# Patient Record
Sex: Female | Born: 1950 | Race: Black or African American | Hispanic: No | Marital: Single | Smoking: Never smoker
Health system: Southern US, Community
[De-identification: ages and names within clinical notes are randomized; demographics above are authoritative.]

## PROBLEM LIST (undated history)

## (undated) DIAGNOSIS — I10 Essential (primary) hypertension: Secondary | ICD-10-CM

---

## 2008-09-30 ENCOUNTER — Emergency Department (HOSPITAL_COMMUNITY): Admission: EM | Admit: 2008-09-30 | Discharge: 2008-09-30 | Payer: Self-pay | Admitting: Emergency Medicine

## 2010-09-14 LAB — URINE MICROSCOPIC-ADD ON

## 2010-09-14 LAB — POCT I-STAT, CHEM 8
BUN: 9 mg/dL (ref 6–23)
Calcium, Ion: 1.12 mmol/L (ref 1.12–1.32)
Chloride: 102 mEq/L (ref 96–112)
HCT: 43 % (ref 36.0–46.0)
Sodium: 140 mEq/L (ref 135–145)

## 2010-09-14 LAB — DIFFERENTIAL
Basophils Absolute: 0 10*3/uL (ref 0.0–0.1)
Basophils Relative: 0 % (ref 0–1)
Lymphocytes Relative: 6 % — ABNORMAL LOW (ref 12–46)
Neutro Abs: 4.4 10*3/uL (ref 1.7–7.7)
Neutrophils Relative %: 80 % — ABNORMAL HIGH (ref 43–77)

## 2010-09-14 LAB — URINALYSIS, ROUTINE W REFLEX MICROSCOPIC
Glucose, UA: NEGATIVE mg/dL
Ketones, ur: NEGATIVE mg/dL
Nitrite: NEGATIVE
Protein, ur: NEGATIVE mg/dL
pH: 7 (ref 5.0–8.0)

## 2010-09-14 LAB — CBC
MCHC: 34.6 g/dL (ref 30.0–36.0)
Platelets: 292 10*3/uL (ref 150–400)
RDW: 14.9 % (ref 11.5–15.5)

## 2020-06-28 ENCOUNTER — Other Ambulatory Visit: Payer: Self-pay

## 2020-06-28 ENCOUNTER — Encounter (HOSPITAL_COMMUNITY): Payer: Self-pay | Admitting: Emergency Medicine

## 2020-06-28 ENCOUNTER — Emergency Department (HOSPITAL_COMMUNITY): Payer: Medicaid - Out of State

## 2020-06-28 ENCOUNTER — Emergency Department (HOSPITAL_COMMUNITY)
Admission: EM | Admit: 2020-06-28 | Discharge: 2020-06-29 | Disposition: A | Discharge status: Home / Self Care | Payer: Medicaid - Out of State | Attending: Emergency Medicine | Admitting: Emergency Medicine

## 2020-06-28 DIAGNOSIS — Z5321 Procedure and treatment not carried out due to patient leaving prior to being seen by health care provider: Secondary | ICD-10-CM | POA: Insufficient documentation

## 2020-06-28 DIAGNOSIS — R079 Chest pain, unspecified: Secondary | ICD-10-CM | POA: Diagnosis present

## 2020-06-28 DIAGNOSIS — R0602 Shortness of breath: Secondary | ICD-10-CM | POA: Diagnosis not present

## 2020-06-28 HISTORY — DX: Essential (primary) hypertension: I10

## 2020-06-28 LAB — BASIC METABOLIC PANEL
Anion gap: 10 (ref 5–15)
BUN: 21 mg/dL (ref 8–23)
CO2: 21 mmol/L — ABNORMAL LOW (ref 22–32)
Calcium: 9 mg/dL (ref 8.9–10.3)
Chloride: 102 mmol/L (ref 98–111)
Creatinine, Ser: 1.19 mg/dL — ABNORMAL HIGH (ref 0.44–1.00)
GFR, Estimated: 50 mL/min — ABNORMAL LOW (ref >60)
Glucose, Bld: 120 mg/dL — ABNORMAL HIGH (ref 70–99)
Potassium: 2.7 mmol/L — CL (ref 3.5–5.1)
Sodium: 133 mmol/L — ABNORMAL LOW (ref 135–145)

## 2020-06-28 LAB — CBC
HCT: 34.7 % — ABNORMAL LOW (ref 36.0–46.0)
Hemoglobin: 11.9 g/dL — ABNORMAL LOW (ref 12.0–15.0)
MCH: 31.5 pg (ref 26.0–34.0)
MCHC: 34.3 g/dL (ref 30.0–36.0)
MCV: 91.8 fL (ref 80.0–100.0)
Platelets: 264 10*3/uL (ref 150–400)
RBC: 3.78 MIL/uL — ABNORMAL LOW (ref 3.87–5.11)
RDW: 13.4 % (ref 11.5–15.5)
WBC: 12.6 10*3/uL — ABNORMAL HIGH (ref 4.0–10.5)
nRBC: 0 % (ref 0.0–0.2)

## 2020-06-28 LAB — TROPONIN I (HIGH SENSITIVITY): Troponin I (High Sensitivity): 24 ng/L — ABNORMAL HIGH (ref <18)

## 2020-06-28 NOTE — ED Notes (Signed)
Pt called 3x no answer  

## 2020-06-28 NOTE — ED Triage Notes (Signed)
Patient with right and central chest pain that started yesterday.  She states that she does have some shortness of breath with the pain.  No nausea or vomiting at this time.

## 2020-06-30 ENCOUNTER — Emergency Department (HOSPITAL_COMMUNITY): Payer: Medicaid - Out of State

## 2020-06-30 ENCOUNTER — Encounter (HOSPITAL_COMMUNITY): Payer: Self-pay | Admitting: Emergency Medicine

## 2020-06-30 ENCOUNTER — Other Ambulatory Visit: Payer: Self-pay

## 2020-06-30 ENCOUNTER — Emergency Department (HOSPITAL_COMMUNITY)
Admission: EM | Admit: 2020-06-30 | Discharge: 2020-07-01 | Disposition: A | Discharge status: Home / Self Care | Payer: Medicaid - Out of State | Attending: Emergency Medicine | Admitting: Emergency Medicine

## 2020-06-30 DIAGNOSIS — J189 Pneumonia, unspecified organism: Secondary | ICD-10-CM | POA: Insufficient documentation

## 2020-06-30 DIAGNOSIS — I10 Essential (primary) hypertension: Secondary | ICD-10-CM | POA: Insufficient documentation

## 2020-06-30 DIAGNOSIS — R079 Chest pain, unspecified: Secondary | ICD-10-CM | POA: Diagnosis present

## 2020-06-30 DIAGNOSIS — Z20822 Contact with and (suspected) exposure to covid-19: Secondary | ICD-10-CM | POA: Insufficient documentation

## 2020-06-30 LAB — CBC
HCT: 35.2 % — ABNORMAL LOW (ref 36.0–46.0)
Hemoglobin: 11.3 g/dL — ABNORMAL LOW (ref 12.0–15.0)
MCH: 30.4 pg (ref 26.0–34.0)
MCHC: 32.1 g/dL (ref 30.0–36.0)
MCV: 94.6 fL (ref 80.0–100.0)
Platelets: 307 10*3/uL (ref 150–400)
RBC: 3.72 MIL/uL — ABNORMAL LOW (ref 3.87–5.11)
RDW: 13.4 % (ref 11.5–15.5)
WBC: 15 10*3/uL — ABNORMAL HIGH (ref 4.0–10.5)
nRBC: 0 % (ref 0.0–0.2)

## 2020-06-30 LAB — BASIC METABOLIC PANEL
Anion gap: 10 (ref 5–15)
BUN: 6 mg/dL — ABNORMAL LOW (ref 8–23)
CO2: 25 mmol/L (ref 22–32)
Calcium: 8.6 mg/dL — ABNORMAL LOW (ref 8.9–10.3)
Chloride: 98 mmol/L (ref 98–111)
Creatinine, Ser: 0.77 mg/dL (ref 0.44–1.00)
GFR, Estimated: >60 mL/min (ref >60)
Glucose, Bld: 99 mg/dL (ref 70–99)
Potassium: 2.6 mmol/L — CL (ref 3.5–5.1)
Sodium: 133 mmol/L — ABNORMAL LOW (ref 135–145)

## 2020-06-30 LAB — POC SARS CORONAVIRUS 2 AG -  ED: SARS Coronavirus 2 Ag: NEGATIVE

## 2020-06-30 LAB — TROPONIN I (HIGH SENSITIVITY)
Troponin I (High Sensitivity): 34 ng/L — ABNORMAL HIGH (ref <18)
Troponin I (High Sensitivity): 31 ng/L — ABNORMAL HIGH (ref <18)

## 2020-06-30 MED ORDER — POTASSIUM CHLORIDE 10 MEQ/100ML IV SOLN
10.0000 meq | INTRAVENOUS | Status: AC
  Administered 2020-06-30 – 2020-07-01 (×2): 10 meq via INTRAVENOUS
  Filled 2020-06-30 (×2): qty 100

## 2020-06-30 MED ORDER — ALBUTEROL SULFATE HFA 108 (90 BASE) MCG/ACT IN AERS
2.0000 | INHALATION_SPRAY | Freq: Four times a day (QID) | RESPIRATORY_TRACT | Status: DC
  Administered 2020-07-01: 2 via RESPIRATORY_TRACT
  Filled 2020-06-30: qty 6.7

## 2020-06-30 MED ORDER — OXYCODONE-ACETAMINOPHEN 5-325 MG PO TABS
1.0000 | ORAL_TABLET | Freq: Once | ORAL | Status: AC
  Administered 2020-07-01: 1 via ORAL
  Filled 2020-06-30: qty 1

## 2020-06-30 MED ORDER — POTASSIUM CHLORIDE CRYS ER 20 MEQ PO TBCR
40.0000 meq | EXTENDED_RELEASE_TABLET | Freq: Once | ORAL | Status: AC
  Administered 2020-06-30: 40 meq via ORAL
  Filled 2020-06-30: qty 2

## 2020-06-30 MED ORDER — DOXYCYCLINE HYCLATE 100 MG PO CAPS: 100.0000 mg | ORAL_CAPSULE | Freq: Two times a day (BID) | ORAL | 0 refills | Status: DC

## 2020-06-30 MED ORDER — DOXYCYCLINE HYCLATE 100 MG PO TABS
100.0000 mg | ORAL_TABLET | Freq: Once | ORAL | Status: AC
  Administered 2020-07-01: 100 mg via ORAL
  Filled 2020-06-30: qty 1

## 2020-06-30 NOTE — ED Triage Notes (Signed)
Pt c/o right sided chest and breast pain x 3 days. Pt appears short of breath as well. No other symptoms at this time.

## 2020-06-30 NOTE — Discharge Instructions (Addendum)
You were seen in the emergency department for right-sided breast/chest pain, cough, phlegm  Chest x-ray showed pneumonia in your right upper lung  Your quick Covid test is negative but your second PCR Covid test is pending.  This second COVID test is more accurate and will confirm or rule out COVID infection.  The PCR COVID test results will be back in the next 6 to 24 hours.  I recommend you isolate and wear a mask at all times until your results are back.  You were also tested for tuberculosis. Check your MyChart account for these results.  Instructions to make a MyChart account are below.   Take doxycycline which is an antibiotic morning and night for the next 7 days.  Take 2 puffs of albuterol inhaler every 6 hours to help open up your airways and help with chest tightness, shortness of breath, cough  For pain you can take 650 mg to 1000 mg of acetaminophen (Tylenol) every 6-8 hours  Return to the ED for worsening pain, fever, shortness of breath  Make an appointment with your primary care doctor in the next 3-4 weeks, you need a repeat chest x-ray to make sure this pneumonia has cleared after antibiotics

## 2020-06-30 NOTE — ED Provider Notes (Cosign Needed Addendum)
[facility] EMERGENCY DEPARTMENT Provider Note   CSN: [id number] Arrival date & time: 06/30/20  1814     History Chief Complaint  Patient presents with  . Chest Pain    Antrice Pal is a 70 y.o. female with history of hypertension presents to the ED for evaluation of "breast pain" that began Sunday night. Sharp, non radiating, non exertional.  This is constant, mild but much worse with any movement, twisting, coughing. Has been taking BC powders and Alka-Seltzer plus without improvement. Has associated productive cough with clear and green phlegm, chills and shortness of breath with exertion. Unknown fevers. Some nausea but no vomiting. Unvaccinated for COVID-19. Reports her friend had similar symptoms but doesn't know if he had COVID. Patient lives in [place] and is here visiting her daughter. Reports smoking crack cocaine at least 1-2 times a month, last use was 10 days ago. No tobacco use. Denies any other illicit drug use including IV drugs. Denies congestion, sore throat, headaches, vomiting, abdominal pain, diarrhea, changes in taste or smell. No hemoptysis. No history of DVT or PE, leg swelling, calf pain, recent surgery, prolonged immobilization, hormone therapy. No history or exposure to TB, recent incarceration, homelessness, travel.   HPI     Past Medical History:  Diagnosis Date  . Hypertension     There are no problems to display for this patient.   History reviewed. No pertinent surgical history.   OB History   No obstetric history on file.     No family history on file.  Social History   Tobacco Use  . Smoking status: Never Smoker  . Smokeless tobacco: Never Used    Home Medications Prior to Admission medications   Medication Sig Start Date End Date Taking? Authorizing Provider  doxycycline (VIBRAMYCIN) 100 MG capsule Take 1 capsule (100 mg total) by mouth 2 (two) times daily. 06/30/20  Yes Liberty Handy, PA-C     Allergies    Sulfa antibiotics  Review of Systems   Review of Systems  Constitutional: Positive for chills.  Respiratory: Positive for cough and shortness of breath.   Cardiovascular: Positive for chest pain.  All other systems reviewed and are negative.   Physical Exam Updated Vital Signs BP (!) 148/70   Pulse 75   Temp 99.8 F (37.7 C) (Oral)   Resp (!) 27   SpO2 97%   Physical Exam Vitals and nursing note reviewed.  Constitutional:      General: She is not in acute distress.    Appearance: She is well-developed and well-nourished.     Comments: NAD.  HENT:     Head: Normocephalic and atraumatic.     Right Ear: External ear normal.     Left Ear: External ear normal.     Nose: Nose normal.  Eyes:     General: No scleral icterus.    Extraocular Movements: EOM normal.     Conjunctiva/sclera: Conjunctivae normal.  Cardiovascular:     Rate and Rhythm: Normal rate and regular rhythm.     Heart sounds: Normal heart sounds. No murmur heard.     Comments: No lower extremity edema. No calf tenderness. Pulmonary:     Effort: Pulmonary effort is normal. Tachypnea present.     Breath sounds: Examination of the left-upper field reveals rhonchi. Examination of the left-middle field reveals rhonchi. Rhonchi present. No wheezing.     Comments: Wet sounding cough during exam. Tachypnea, RR in the mid 30s. Subtle rhonchi/rales in the  right upper/middle lobes. No wheezing. Musculoskeletal:        General: No deformity. Normal range of motion.     Cervical back: Normal range of motion and neck supple.  Skin:    General: Skin is warm and dry.     Capillary Refill: Capillary refill takes less than 2 seconds.  Neurological:     Mental Status: She is alert and oriented to person, place, and time.  Psychiatric:        Mood and Affect: Mood and affect normal.        Behavior: Behavior normal.        Thought Content: Thought content normal.        Judgment: Judgment normal.      ED Results / Procedures / Treatments   Labs (all labs ordered are listed, but only abnormal results are displayed) Labs Reviewed  BASIC METABOLIC PANEL - Abnormal; Notable for the following components:      Result Value   Sodium 133 (*)    Potassium 2.6 (*)    BUN 6 (*)    Calcium 8.6 (*)    All other components within normal limits  CBC - Abnormal; Notable for the following components:   WBC 15.0 (*)    RBC 3.72 (*)    Hemoglobin 11.3 (*)    HCT 35.2 (*)    All other components within normal limits  TROPONIN I (HIGH SENSITIVITY) - Abnormal; Notable for the following components:   Troponin I (High Sensitivity) 34 (*)    All other components within normal limits  TROPONIN I (HIGH SENSITIVITY) - Abnormal; Notable for the following components:   Troponin I (High Sensitivity) 31 (*)    All other components within normal limits  SARS CORONAVIRUS 2 (TAT 6-24 HRS)  QUANTIFERON-TB GOLD PLUS  POC SARS CORONAVIRUS 2 AG -  ED    EKG None  Radiology DG Chest 2 View  Result Date: 06/30/2020 CLINICAL DATA:  70 year old female with chest pain. EXAM: CHEST - 2 VIEW COMPARISON:  Chest radiograph dated 06/28/2020. FINDINGS: Slight interval progression of patchy right upper lobe opacity most consistent with pneumonia. Clinical correlation and follow-up to resolution recommended. No pleural effusion pneumothorax. The cardiac silhouette is within limits. Atherosclerotic calcification of the aortic arch. No acute osseous pathology. IMPRESSION: Persistent right upper lobe pneumonia. Clinical correlation and follow-up to resolution recommended. Electronically Signed   By: Elgie Collard M.D.   On: 06/30/2020 18:36    Procedures Procedures   Medications Ordered in ED Medications  potassium chloride 10 mEq in 100 mL IVPB (10 mEq Intravenous New Bag/Given 06/30/20 2136)  doxycycline (VIBRA-TABS) tablet 100 mg (has no administration in time range)  oxyCODONE-acetaminophen (PERCOCET/ROXICET)  5-325 MG per tablet 1 tablet (has no administration in time range)  albuterol (VENTOLIN HFA) 108 (90 Base) MCG/ACT inhaler 2 puff (has no administration in time range)  potassium chloride SA (KLOR-CON) CR tablet 40 mEq (40 mEq Oral Given 06/30/20 2138)    ED Course  I have reviewed the triage vital signs and the nursing notes.  Pertinent labs & imaging results that were available during my care of the patient were reviewed by me and considered in my medical decision making (see chart for details).  Clinical Course as of 06/30/20 2327  Wed Jun 30, 2020  2056 DG Chest 2 View FINDINGS: Slight interval progression of patchy right upper lobe opacity most consistent with pneumonia. Clinical correlation and follow-up to resolution recommended. No pleural effusion pneumothorax. The  cardiac silhouette is within limits. Atherosclerotic calcification of the aortic arch. No acute osseous pathology. [CG]  2056 Temp: 99.8 F (37.7 C) [CG]  2056 WBC(!): 15.0 [CG]  2056 Hemoglobin(!): 11.3 [CG]  2056 Potassium(!!): 2.6 [CG]  2056 Troponin I (High Sensitivity)(!): 34 [CG]  2201 ED EKG Sinus rhythm with occasional Premature ventricular complexes Nonspecific ST and T wave abnormality Abnormal ECG No STEMI Confirmed by Alona Bene [id number]) on 06/30/2020 8:59:21 PM [CG]  2239 Re-evaluated patient. Ambulated to bathroom - denies SOB. Will ask RN to formally ambulate.  Denies exposure to TB, recent incarceration, homeless shelters. Had friend who was sick but doesn't know if he had COVID [CG]    Clinical Course User Index [CG] Jerrell Mylar   MDM Rules/Calculators/A&P                           70 y.o. yo with chief complaint of right sided "breast" pain for 4 days with productive cough, chills, exertional SOB.  Recent sick contacts, unvaccinated for COVID. Smokes crack cocaine, last 10 days ago. No IVDU. Low risk for TB.   Chief complain involves an extensive number of treatment options and  is a complaint that carries with it a high risk of complications and morbidity and mortality.    Differential diagnosis: COVID, community acquired PNA.  Considered PE however she has no hypoxia, tachycardia and has productive cough with phlegm.  ACS considered less likely as well given respiratory symptoms, right sided well localized sharp pain.   ER lab work and imaging ordered by triage RN and me, as above  I have personally visualized and interpreted ER diagnostic work up including labs and imaging.    Labs reveal - WBC 15.0, hemoglobin 11.3, K 2.6, Na 133. Trop 34 > 31. EKG with non specific TW abnormalities, no others to compare.  POC COVID negative, suspicion is high given sick contacts and symptoms. PCR pending at discharge. Quantiferon gold plus pending at discharge.  HEART score is technically 4 however CP is very atypical as well as URI symptoms, well localized, positional and posttussive CP, ACS considered but unlikely.    Imaging reveals - interval progression of right upper lobe opacity consistent with pneumonia, no effusion.    Medications ordered - K IV and PO, percocet for pain   Ordered continuous cardiac and pulse ox monitoring.  Will plan for serial re-examinations. Close monitoring. Will ambulate with pulse ox.   2324: Re-evaluated the patient.   Ambulated without hypoxia. Eating [employer]. Appears well.  Afebrile and with normal HR, SpO2 thus far.    Overall, clinical presentation and ER work up most suggestive of likely community acquired pneumonia.  PCR and quantiferon gold pending at discharge. Will discharge with doxycycline, albuterol inhaler, tylenol for pain. Return precautions discussed. Patient aware of symptoms that would warrant return to ER. She is aware she needs repeat CXR in 3-4 weeks to check for resolution. Discussed with EDP Long.   Final Clinical Impression(s) / ED Diagnoses Final diagnoses:  Pneumonia of right upper lobe due to infectious organism     Rx / DC Orders ED Discharge Orders         Ordered    doxycycline (VIBRAMYCIN) 100 MG capsule  2 times daily        06/30/20 2327           Liberty Handy, PA-C 06/30/20 2327    Liberty Handy, PA-C 06/30/20  7253    Maia Plan, MD 07/02/20 1151

## 2020-06-30 NOTE — ED Notes (Signed)
Ambulated pt on pulse ox, spo2 dropped to 93 

## 2020-07-03 LAB — QUANTIFERON-TB GOLD PLUS: QuantiFERON-TB Gold Plus: UNDETERMINED — AB

## 2020-07-03 LAB — QUANTIFERON-TB GOLD PLUS (RQFGPL)
QuantiFERON Mitogen Value: 0.3 IU/mL
QuantiFERON Nil Value: 0 IU/mL
QuantiFERON TB1 Ag Value: 0 IU/mL
QuantiFERON TB2 Ag Value: 0 IU/mL

## 2020-11-29 ENCOUNTER — Other Ambulatory Visit: Payer: Self-pay | Admitting: Family Medicine

## 2020-11-29 DIAGNOSIS — I10 Essential (primary) hypertension: Secondary | ICD-10-CM | POA: Diagnosis not present

## 2020-11-29 DIAGNOSIS — Z1231 Encounter for screening mammogram for malignant neoplasm of breast: Secondary | ICD-10-CM

## 2020-12-15 ENCOUNTER — Telehealth: Payer: Self-pay | Admitting: Hematology and Oncology

## 2020-12-15 NOTE — Telephone Encounter (Signed)
Received a new hem referral from [facility] for leukopenia. [facility] has been cld and scheduled to see Dr. Leonides Schanz on 7/20 at 1pm. Pt aware to arrive 20 minutes early

## 2020-12-22 ENCOUNTER — Other Ambulatory Visit: Payer: Self-pay

## 2020-12-22 ENCOUNTER — Inpatient Hospital Stay: Payer: Medicaid - Out of State | Attending: Hematology and Oncology | Admitting: Hematology and Oncology

## 2020-12-22 ENCOUNTER — Inpatient Hospital Stay: Payer: Medicaid - Out of State

## 2020-12-22 VITALS — BP 167/88 | HR 71 | Temp 97.9°F | Resp 18 | Ht 63.0 in | Wt 161.5 lb

## 2020-12-22 DIAGNOSIS — D709 Neutropenia, unspecified: Secondary | ICD-10-CM | POA: Diagnosis not present

## 2020-12-22 DIAGNOSIS — D708 Other neutropenia: Secondary | ICD-10-CM

## 2020-12-22 LAB — VITAMIN B12: Vitamin B-12: 79 pg/mL — ABNORMAL LOW (ref 180–914)

## 2020-12-22 LAB — CBC WITH DIFFERENTIAL (CANCER CENTER ONLY)
Abs Immature Granulocytes: 0 10*3/uL (ref 0.00–0.07)
Basophils Absolute: 0 10*3/uL (ref 0.0–0.1)
Basophils Relative: 1 %
Eosinophils Absolute: 0.1 10*3/uL (ref 0.0–0.5)
Eosinophils Relative: 3 %
HCT: 37.4 % (ref 36.0–46.0)
Hemoglobin: 12.7 g/dL (ref 12.0–15.0)
Immature Granulocytes: 0 %
Lymphocytes Relative: 19 %
Lymphs Abs: 0.6 10*3/uL — ABNORMAL LOW (ref 0.7–4.0)
MCH: 30 pg (ref 26.0–34.0)
MCHC: 34 g/dL (ref 30.0–36.0)
MCV: 88.2 fL (ref 80.0–100.0)
Monocytes Absolute: 0.8 10*3/uL (ref 0.1–1.0)
Monocytes Relative: 27 %
Neutro Abs: 1.5 10*3/uL — ABNORMAL LOW (ref 1.7–7.7)
Neutrophils Relative %: 50 %
Platelet Count: 325 10*3/uL (ref 150–400)
RBC: 4.24 MIL/uL (ref 3.87–5.11)
RDW: 14.5 % (ref 11.5–15.5)
WBC Count: 3 10*3/uL — ABNORMAL LOW (ref 4.0–10.5)
nRBC: 0 % (ref 0.0–0.2)

## 2020-12-22 LAB — CMP (CANCER CENTER ONLY)
ALT: 14 U/L (ref 0–44)
AST: 22 U/L (ref 15–41)
Albumin: 4.2 g/dL (ref 3.5–5.0)
Alkaline Phosphatase: 109 U/L (ref 38–126)
Anion gap: 7 (ref 5–15)
BUN: 10 mg/dL (ref 8–23)
CO2: 28 mmol/L (ref 22–32)
Calcium: 9.7 mg/dL (ref 8.9–10.3)
Chloride: 106 mmol/L (ref 98–111)
Creatinine: 0.56 mg/dL (ref 0.44–1.00)
GFR, Estimated: >60 mL/min (ref >60)
Glucose, Bld: 96 mg/dL (ref 70–99)
Potassium: 3.5 mmol/L (ref 3.5–5.1)
Sodium: 141 mmol/L (ref 135–145)
Total Bilirubin: 0.8 mg/dL (ref 0.3–1.2)
Total Protein: 8.8 g/dL — ABNORMAL HIGH (ref 6.5–8.1)

## 2020-12-22 LAB — HEPATITIS B SURFACE ANTIGEN: Hepatitis B Surface Ag: NONREACTIVE

## 2020-12-22 LAB — FOLATE: Folate: 19.4 ng/mL (ref >5.9)

## 2020-12-22 LAB — LACTATE DEHYDROGENASE: LDH: 220 U/L — ABNORMAL HIGH (ref 98–192)

## 2020-12-22 LAB — SAVE SMEAR(SSMR), FOR PROVIDER SLIDE REVIEW

## 2020-12-22 LAB — HIV ANTIBODY (ROUTINE TESTING W REFLEX): HIV Screen 4th Generation wRfx: NONREACTIVE

## 2020-12-22 LAB — HEPATITIS C ANTIBODY: HCV Ab: NONREACTIVE

## 2020-12-22 LAB — HEPATITIS B CORE ANTIBODY, TOTAL: Hep B Core Total Ab: REACTIVE — AB

## 2020-12-22 LAB — SEDIMENTATION RATE: Sed Rate: 26 mm/hr — ABNORMAL HIGH (ref 0–22)
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
## 2020-12-23 ENCOUNTER — Telehealth: Payer: Self-pay | Admitting: *Deleted

## 2020-12-23 MED ORDER — VITAMIN B-12 1000 MCG PO TABS: 1000.0000 ug | ORAL_TABLET | Freq: Every day | ORAL | 3 refills | Status: DC

## 2020-12-23 NOTE — Telephone Encounter (Signed)
TCT patient regarding recent lab results. Spoke with pt and advised that the only abnormality was a low B12 level. Advised that Dr. Leonides Schanz recommends a B12 supplement daily. Pt is agreeable to this. Pt's pharmacy is [facility] on [address].

## 2020-12-23 NOTE — Telephone Encounter (Signed)
-----   Message from Jaci Standard, MD sent at 12/23/2020  8:17 AM EDT ----- Please let Mrs. Bresnan know that her labs were consistent with Vitamin B12 deficiency. Please call in PO daily of vit B12 to a pharmacy of her choosing. She will need an appointment in 3 months time to reassess.  ----- Message ----- From: Leory Plowman, [facility] Sent: 12/22/2020   2:40 PM EDT To: Jaci Standard, MD

## 2021-01-03 ENCOUNTER — Telehealth: Payer: Self-pay | Admitting: Hematology and Oncology

## 2021-01-03 NOTE — Telephone Encounter (Signed)
Scheduled appts per 8/1 sch msg. Pt aware.  

## 2021-01-05 ENCOUNTER — Telehealth: Payer: Self-pay | Admitting: *Deleted

## 2021-01-05 NOTE — Telephone Encounter (Signed)
-----   Message from Jaci Standard, MD sent at 01/03/2021  2:10 PM EDT ----- Please let [facility] know that her labs are most consistent with Vitamin b12 deficiency. This is the likely cause of her low WBC. Please call in Vitamin b12 PO daily to a pharmacy of her choosing. We will plan to see her back in 3 months to re-evaluate

## 2021-01-05 NOTE — Telephone Encounter (Signed)
Pt has been made aware of low B12 on 12/23/20

## 2021-01-21 DIAGNOSIS — M25561 Pain in right knee: Secondary | ICD-10-CM | POA: Diagnosis not present

## 2021-01-21 DIAGNOSIS — M25562 Pain in left knee: Secondary | ICD-10-CM | POA: Diagnosis not present

## 2021-01-21 DIAGNOSIS — M1711 Unilateral primary osteoarthritis, right knee: Secondary | ICD-10-CM | POA: Diagnosis not present

## 2021-01-21 DIAGNOSIS — M1712 Unilateral primary osteoarthritis, left knee: Secondary | ICD-10-CM | POA: Diagnosis not present

## 2021-01-31 ENCOUNTER — Inpatient Hospital Stay: Admission: RE | Admit: 2021-01-31 | Payer: Medicaid - Out of State | Source: Ambulatory Visit

## 2021-03-01 DIAGNOSIS — M1711 Unilateral primary osteoarthritis, right knee: Secondary | ICD-10-CM | POA: Diagnosis not present

## 2021-05-03 DIAGNOSIS — M1711 Unilateral primary osteoarthritis, right knee: Secondary | ICD-10-CM | POA: Diagnosis not present

## 2021-06-27 ENCOUNTER — Ambulatory Visit (HOSPITAL_COMMUNITY)
Admission: EM | Admit: 2021-06-27 | Discharge: 2021-06-27 | Disposition: A | Discharge status: Home / Self Care | Payer: Medicaid Other

## 2021-06-27 ENCOUNTER — Other Ambulatory Visit: Payer: Self-pay

## 2021-06-27 ENCOUNTER — Emergency Department (HOSPITAL_COMMUNITY): Payer: Medicaid Other

## 2021-06-27 ENCOUNTER — Encounter (HOSPITAL_COMMUNITY): Payer: Self-pay

## 2021-06-27 ENCOUNTER — Emergency Department (HOSPITAL_COMMUNITY)
Admission: EM | Admit: 2021-06-27 | Discharge: 2021-06-28 | Disposition: A | Discharge status: Home / Self Care | Payer: Medicaid Other | Attending: Emergency Medicine | Admitting: Emergency Medicine

## 2021-06-27 DIAGNOSIS — K5901 Slow transit constipation: Secondary | ICD-10-CM

## 2021-06-27 DIAGNOSIS — Z79899 Other long term (current) drug therapy: Secondary | ICD-10-CM | POA: Insufficient documentation

## 2021-06-27 DIAGNOSIS — I161 Hypertensive emergency: Secondary | ICD-10-CM

## 2021-06-27 DIAGNOSIS — R519 Headache, unspecified: Secondary | ICD-10-CM | POA: Diagnosis not present

## 2021-06-27 DIAGNOSIS — I10 Essential (primary) hypertension: Secondary | ICD-10-CM | POA: Diagnosis not present

## 2021-06-27 DIAGNOSIS — R110 Nausea: Secondary | ICD-10-CM | POA: Diagnosis not present

## 2021-06-27 DIAGNOSIS — K5900 Constipation, unspecified: Secondary | ICD-10-CM | POA: Insufficient documentation

## 2021-06-27 DIAGNOSIS — R079 Chest pain, unspecified: Secondary | ICD-10-CM | POA: Diagnosis not present

## 2021-06-27 DIAGNOSIS — R109 Unspecified abdominal pain: Secondary | ICD-10-CM | POA: Diagnosis not present

## 2021-06-27 LAB — COMPREHENSIVE METABOLIC PANEL
ALT: 18 U/L (ref 0–44)
AST: 26 U/L (ref 15–41)
Albumin: 4.1 g/dL (ref 3.5–5.0)
Alkaline Phosphatase: 104 U/L (ref 38–126)
Anion gap: 9 (ref 5–15)
BUN: <5 mg/dL — ABNORMAL LOW (ref 8–23)
CO2: 26 mmol/L (ref 22–32)
Calcium: 9.6 mg/dL (ref 8.9–10.3)
Chloride: 103 mmol/L (ref 98–111)
Creatinine, Ser: 0.69 mg/dL (ref 0.44–1.00)
GFR, Estimated: >60 mL/min (ref >60)
Glucose, Bld: 102 mg/dL — ABNORMAL HIGH (ref 70–99)
Potassium: 3 mmol/L — ABNORMAL LOW (ref 3.5–5.1)
Sodium: 138 mmol/L (ref 135–145)
Total Bilirubin: 0.9 mg/dL (ref 0.3–1.2)
Total Protein: 9.1 g/dL — ABNORMAL HIGH (ref 6.5–8.1)

## 2021-06-27 LAB — CBC
HCT: 43.3 % (ref 36.0–46.0)
Hemoglobin: 14 g/dL (ref 12.0–15.0)
MCH: 27.8 pg (ref 26.0–34.0)
MCHC: 32.3 g/dL (ref 30.0–36.0)
MCV: 86.1 fL (ref 80.0–100.0)
Platelets: 298 10*3/uL (ref 150–400)
RBC: 5.03 MIL/uL (ref 3.87–5.11)
RDW: 14.5 % (ref 11.5–15.5)
WBC: 2.9 10*3/uL — ABNORMAL LOW (ref 4.0–10.5)
nRBC: 0 % (ref 0.0–0.2)

## 2021-06-27 LAB — URINALYSIS, ROUTINE W REFLEX MICROSCOPIC
Bacteria, UA: NONE SEEN
Bilirubin Urine: NEGATIVE
Glucose, UA: NEGATIVE mg/dL
Ketones, ur: NEGATIVE mg/dL
Leukocytes,Ua: NEGATIVE
Nitrite: NEGATIVE
Protein, ur: NEGATIVE mg/dL
Specific Gravity, Urine: 1.011 (ref 1.005–1.030)
pH: 6 (ref 5.0–8.0)

## 2021-06-27 LAB — LIPASE, BLOOD: Lipase: 35 U/L (ref 11–51)

## 2021-06-27 NOTE — ED Triage Notes (Signed)
Pt c/o constipation x5-6days, pain to lower back, and decrease appetite.   Pt requesting refill on her b/p meds., states has been out for 3wks.

## 2021-06-27 NOTE — ED Provider Triage Note (Signed)
Emergency Medicine Provider Triage Evaluation Note  Valerie Dunn , a 71 y.o. female  was evaluated in triage.  Pt was sent from urgent care due to hypertension.  Has been out of medication for a few weeks and they told her that she was potentially having a stroke.  She presented to urgent care for concern of no bowel movement in 4 to 5 days.  Still passing gas.  Took 1 laxative on 1 occasion and says it did not work.  Review of Systems  Positive:  Negative:   Physical Exam  BP (!) 171/109    Pulse 92    Temp 98.2 F (36.8 C) (Oral)    Resp 18    SpO2 96%  Gen:   Awake, no distress   Resp:  Normal effort  MSK:   Moves extremities without difficulty  Other:  Patient with no focal deficits, 5 out of 5 strength in all extremities, no evidence of stroke.  Elevated blood pressure however does not need to be further evaluated for stroke at this time  Abdomen somewhat distended, nontender  Medical Decision Making  Medically screening exam initiated at 6:53 PM.  Appropriate orders placed.  Valerie Dunn was informed that the remainder of the evaluation will be completed by another provider, this initial triage assessment does not replace that evaluation, and the importance of remaining in the ED until their evaluation is complete.    Valerie Dunn, [place] 06/27/21 1855

## 2021-06-27 NOTE — ED Provider Notes (Signed)
[place]    CSN: [id number] Arrival date & time: 06/27/21  1521      History   Chief Complaint Chief Complaint  Patient presents with   Constipation    HPI Valerie Dunn is a 71 y.o. female presenting with constipation for about 6 days, back pain, nausea, dizziness, headaches.  She has been out of her blood pressure medications for about 3 weeks as she is out of town.  States that she has not had a bowel movement in about 6 days and she is still passing gas.  Now she is having some nausea without vomiting.  Endorses some left-sided abdominal pain, as well as back pain.  Has tried an over-the-counter laxative, this did not provide any relief.  Also states that she has been out of her blood pressure medications for about 3 weeks, she is now having headaches and dizziness, but denies vision changes, weakness, chest pain, shortness of breath. No prior history abd surgeries.  HPI  Past Medical History:  Diagnosis Date   Hypertension     There are no problems to display for this patient.   History reviewed. No pertinent surgical history.  OB History   No obstetric history on file.      Home Medications    Prior to Admission medications   Medication Sig Start Date End Date Taking? Authorizing Provider  amLODipine (NORVASC) 5 MG tablet Take 5 mg by mouth daily. 11/29/20   [provider]  traZODone (DESYREL) 50 MG tablet Take 50 mg by mouth at bedtime as needed. 11/29/20   [provider]  vitamin B-12 (CYANOCOBALAMIN) 1000 MCG tablet Take 1 tablet (1,000 mcg total) by mouth daily. 12/23/20   Orson Slick, MD    Family History History reviewed. No pertinent family history.  Social History Social History   Tobacco Use   Smoking status: Never   Smokeless tobacco: Never  Substance Use Topics   Alcohol use: Not Currently   Drug use: Not Currently     Allergies   Sulfa antibiotics   Review of Systems Review of Systems   Constitutional:  Negative for appetite change, chills, diaphoresis, fever and unexpected weight change.  HENT:  Negative for congestion, ear pain, sinus pressure, sinus pain, sneezing, sore throat and trouble swallowing.   Respiratory:  Negative for cough, chest tightness and shortness of breath.   Cardiovascular:  Negative for chest pain.  Gastrointestinal:  Positive for abdominal pain. Negative for abdominal distention, anal bleeding, blood in stool, constipation, diarrhea, nausea, rectal pain and vomiting.  Genitourinary:  Negative for dysuria, flank pain, frequency and urgency.  Musculoskeletal:  Negative for back pain and myalgias.  Neurological:  Positive for dizziness and headaches. Negative for light-headedness.  All other systems reviewed and are negative.   Physical Exam Triage Vital Signs ED Triage Vitals  Enc Vitals Group     BP 06/27/21 1615 (!) 199/118     Pulse Rate 06/27/21 1615 83     Resp 06/27/21 1615 18     Temp 06/27/21 1615 98 F (36.7 C)     Temp Source 06/27/21 1615 Oral     SpO2 06/27/21 1615 97 %     Weight --      Height --      Head Circumference --      Peak Flow --      Pain Score 06/27/21 1616 1     Pain Loc --      Pain Edu? --  Excl. in GC? --    No data found.  Updated Vital Signs BP (!) 199/118 (BP Location: Left Arm)    Pulse 83    Temp 98 F (36.7 C) (Oral)    Resp 18    SpO2 97%   Visual Acuity Right Eye Distance:   Left Eye Distance:   Bilateral Distance:    Right Eye Near:   Left Eye Near:    Bilateral Near:     Physical Exam Vitals reviewed.  Constitutional:      Appearance: Normal appearance. She is not diaphoretic.  HENT:     Head: Normocephalic and atraumatic.     Mouth/Throat:     Mouth: Mucous membranes are moist.  Eyes:     Extraocular Movements: Extraocular movements intact.     Pupils: Pupils are equal, round, and reactive to light.  Cardiovascular:     Rate and Rhythm: Normal rate and regular rhythm.      Pulses:          Radial pulses are 2+ on the right side and 2+ on the left side.     Heart sounds: Normal heart sounds.  Pulmonary:     Effort: Pulmonary effort is normal.     Breath sounds: Normal breath sounds.  Abdominal:     General: Bowel sounds are decreased.     Palpations: Abdomen is soft.     Tenderness: There is abdominal tenderness in the left upper quadrant and left lower quadrant. There is no guarding or rebound. Negative signs include Murphy's sign, Rovsing's sign and McBurney's sign.     Comments: BS decreased throughout.   Musculoskeletal:     Right lower leg: No edema.     Left lower leg: No edema.  Skin:    General: Skin is warm.     Capillary Refill: Capillary refill takes less than 2 seconds.  Neurological:     General: No focal deficit present.     Mental Status: She is alert and oriented to person, place, and time.     Comments: CN 2-12 grossly intact, PERRLA, EOMI.  Psychiatric:        Mood and Affect: Mood normal.        Behavior: Behavior normal.        Thought Content: Thought content normal.        Judgment: Judgment normal.     UC Treatments / Results  Labs (all labs ordered are listed, but only abnormal results are displayed) Labs Reviewed - No data to display  EKG   Radiology No results found.  Procedures Procedures (including critical care time)  Medications Ordered in UC Medications - No data to display  Initial Impression / Assessment and Plan / UC Course  I have reviewed the triage vital signs and the nursing notes.  Pertinent labs & imaging results that were available during my care of the patient were reviewed by me and considered in my medical decision making (see chart for details).     This patient is a very pleasant 71 y.o. year old female presenting with hypertensive emergency and constipation x6 days.   BP 199/118. Out of blood pressure medications x3 weeks. Endorses headaches and dizziness. Reassuring neuro  exam.  Also with abdominal pain and constipation x6 days. Failed home treatment with laxative.  Sent to ED via personal vehicle given hypertensive emergency.   Final Clinical Impressions(s) / UC Diagnoses   Final diagnoses:  Hypertensive emergency  Slow transit constipation  Discharge Instructions      -Your blood pressure is so high that it could cause a stroke. In addition, you have headaches and dizziness, which raise concern for stroke. This must be lowered in the emergency setting. Head straight there, call 911 if symptoms get worse on the way. They will probably also do imaging of your abdomen.     ED Prescriptions   None    PDMP not reviewed this encounter.   Hazel Sams, PA-C 06/27/21 [phone]

## 2021-06-27 NOTE — ED Triage Notes (Signed)
Pt sent from UC for eval of constipation x 6 days. Feeling nauseas without vomiting. Pt hypertensive d/t just moving here from [place] and not being able to get her rx transferred here.

## 2021-06-27 NOTE — Discharge Instructions (Signed)
-  Your blood pressure is so high that it could cause a stroke. In addition, you have headaches and dizziness, which raise concern for stroke. This must be lowered in the emergency setting. Head straight there, call 911 if symptoms get worse on the way. They will probably also do imaging of your abdomen.

## 2021-06-28 ENCOUNTER — Emergency Department (HOSPITAL_COMMUNITY): Payer: Medicaid Other

## 2021-06-28 DIAGNOSIS — I10 Essential (primary) hypertension: Secondary | ICD-10-CM | POA: Diagnosis not present

## 2021-06-28 DIAGNOSIS — R110 Nausea: Secondary | ICD-10-CM | POA: Diagnosis not present

## 2021-06-28 DIAGNOSIS — K5900 Constipation, unspecified: Secondary | ICD-10-CM | POA: Diagnosis not present

## 2021-06-28 DIAGNOSIS — R079 Chest pain, unspecified: Secondary | ICD-10-CM | POA: Diagnosis not present

## 2021-06-28 DIAGNOSIS — R519 Headache, unspecified: Secondary | ICD-10-CM | POA: Diagnosis not present

## 2021-06-28 LAB — TROPONIN I (HIGH SENSITIVITY): Troponin I (High Sensitivity): 25 ng/L — ABNORMAL HIGH (ref <18)

## 2021-06-28 LAB — MAGNESIUM: Magnesium: 1.9 mg/dL (ref 1.7–2.4)

## 2021-06-28 MED ORDER — AMLODIPINE BESYLATE 5 MG PO TABS: 5.0000 mg | ORAL_TABLET | Freq: Every day | ORAL | 2 refills | Status: DC

## 2021-06-28 MED ORDER — AMLODIPINE BESYLATE 5 MG PO TABS
5.0000 mg | ORAL_TABLET | Freq: Once | ORAL | Status: AC
  Administered 2021-06-28: 10:00:00 5 mg via ORAL
  Filled 2021-06-28: qty 1

## 2021-06-28 MED ORDER — POTASSIUM CHLORIDE CRYS ER 20 MEQ PO TBCR
40.0000 meq | EXTENDED_RELEASE_TABLET | Freq: Once | ORAL | Status: AC
  Administered 2021-06-28: 12:00:00 40 meq via ORAL
  Filled 2021-06-28: qty 2

## 2021-06-28 MED ORDER — POLYETHYLENE GLYCOL 3350 17 G PO PACK: 17.0000 g | PACK | Freq: Every day | ORAL | 0 refills | Status: AC

## 2021-06-28 NOTE — Discharge Instructions (Signed)
A prescription for your previous blood pressure medication was sent to your pharmacy.  Additionally, there is a prescription for MiraLAX.  This is an over-the-counter medicine.  It helps with constipation.  Typically, you take 1 packet daily to maintain daily soft stools.  For an initial cleanout dose, next 5 packets in 32 ounces of water or Gatorade.  Do this in the comfort of your own home.  After initial cleanout, decrease amount of MiraLAX to maintain regular bowel movements.  You can add a daily fiber, such as Metamucil, to also maintain daily soft stools.  If you take fiber supplementation, remember to drink plenty of water.  Talk to primary care doctor about blood pressure and any further constipation when you see them on February 9.  Please return to the emergency department if you have any new or worsening symptoms of concern.

## 2021-06-28 NOTE — ED Provider Notes (Signed)
[facility] Provider Note   CSN: [id number] Arrival date & time: 06/27/21  1652     History  Chief Complaint  Patient presents with   Constipation    Valerie Dunn is a 71 y.o. female.   Constipation Associated symptoms: nausea   Patient presents from urgent care for concern of hypertension.  She initially presented to urgent care for constipation for the past week.  At urgent care, they identified and hypotension and sent her to the ED.  She reports that she has previously been prescribed amlodipine.  She was not on any other antihypertensive medications.  This was prescribed through her doctor's office in [place].  Patient moved to the local area 1 year ago but, for a while, was traveling back to [place] for her doctor visits.  Over the past 3 weeks, she has not taken any of her amlodipine because she ran out of her medication.  She does have a appointment to establish a primary care doctor in the local area 2 weeks from now.  She denies any current headache, vision changes, areas of weakness or numbness, shortness of breath, chest pain, or vomiting.  She has had intermittent nausea. She has continued to be able to eat and drink.  Per chart review, at urgent care, she did endorse dizziness and headache.  In regards to her constipation, patient has not tried any therapies at home.  She reports that she does not have a history of constipation.      Home Medications Prior to Admission medications   Medication Sig Start Date End Date Taking? Authorizing Provider  polyethylene glycol (MIRALAX) 17 g packet Take 17 g by mouth daily. For an initial cleanout dose, mix 5 packets in 32 ounces of water or Gatorade. 06/28/21  Yes Gloris Manchesterixon, Sandra Tellefsen, MD  traZODone (DESYREL) 50 MG tablet Take 50 mg by mouth at bedtime as needed for sleep. 11/29/20  Yes [provider]  amLODipine (NORVASC) 5 MG tablet Take 1 tablet (5 mg total) by mouth daily. 06/28/21 09/26/21   Gloris Manchesterixon, Girl Schissler, MD  vitamin B-12 (CYANOCOBALAMIN) 1000 MCG tablet Take 1 tablet (1,000 mcg total) by mouth daily. Patient not taking: Reported on 06/28/2021 12/23/20   Jaci Standardorsey, John T IV, MD      Allergies    Sulfa antibiotics    Review of Systems   Review of Systems  Gastrointestinal:  Positive for constipation and nausea.  All other systems reviewed and are negative.  Physical Exam Updated Vital Signs BP (!) 154/74 (BP Location: Right Arm)    Pulse 81    Temp 98 F (36.7 C) (Oral)    Resp 19    SpO2 99%  Physical Exam Vitals and nursing note reviewed.  Constitutional:      General: She is not in acute distress.    Appearance: Normal appearance. She is well-developed and normal weight. She is not ill-appearing, toxic-appearing or diaphoretic.  HENT:     Head: Normocephalic and atraumatic.     Right Ear: External ear normal.     Left Ear: External ear normal.     Nose: Nose normal.     Mouth/Throat:     Mouth: Mucous membranes are moist.     Pharynx: Oropharynx is clear.  Eyes:     General: No scleral icterus.    Extraocular Movements: Extraocular movements intact.     Conjunctiva/sclera: Conjunctivae normal.  Cardiovascular:     Rate and Rhythm: Normal rate and regular rhythm.  Heart sounds: No murmur heard. Pulmonary:     Effort: Pulmonary effort is normal. No respiratory distress.     Breath sounds: Normal breath sounds. No wheezing or rales.  Chest:     Chest wall: No tenderness.  Abdominal:     General: Abdomen is flat.     Palpations: Abdomen is soft.     Tenderness: There is no abdominal tenderness.  Musculoskeletal:        General: No swelling. Normal range of motion.     Cervical back: Normal range of motion and neck supple. No rigidity.     Right lower leg: No edema.     Left lower leg: No edema.  Skin:    General: Skin is warm and dry.     Capillary Refill: Capillary refill takes less than 2 seconds.     Coloration: Skin is not jaundiced or pale.   Neurological:     General: No focal deficit present.     Mental Status: She is alert and oriented to person, place, and time.     Cranial Nerves: No cranial nerve deficit.     Sensory: No sensory deficit.     Motor: No weakness.     Coordination: Coordination normal.  Psychiatric:        Mood and Affect: Mood normal.        Behavior: Behavior normal.        Thought Content: Thought content normal.        Judgment: Judgment normal.    ED Results / Procedures / Treatments   Labs (all labs ordered are listed, but only abnormal results are displayed) Labs Reviewed  COMPREHENSIVE METABOLIC PANEL - Abnormal; Notable for the following components:      Result Value   Potassium 3.0 (*)    Glucose, Bld 102 (*)    BUN <5 (*)    Total Protein 9.1 (*)    All other components within normal limits  CBC - Abnormal; Notable for the following components:   WBC 2.9 (*)    All other components within normal limits  URINALYSIS, ROUTINE W REFLEX MICROSCOPIC - Abnormal; Notable for the following components:   Hgb urine dipstick SMALL (*)    All other components within normal limits  TROPONIN I (HIGH SENSITIVITY) - Abnormal; Notable for the following components:   Troponin I (High Sensitivity) 25 (*)    All other components within normal limits  LIPASE, BLOOD  MAGNESIUM    EKG None  Radiology DG Abdomen 1 View  Result Date: 06/27/2021 CLINICAL DATA:  Constipation for 6 days.  Back pain. EXAM: ABDOMEN - 1 VIEW COMPARISON:  None. FINDINGS: Supine view of the abdomen and pelvis demonstrates a nonobstructive bowel-gas pattern. There is a large amount of stool throughout especially the left-sided colon. No abnormal abdominal calcifications. No appendicolith. Probable phleboliths in the pelvis. Minimal convex right thoracic spine curvature. IMPRESSION: 1. No acute findings. 2. Possible constipation. Electronically Signed   By: Jeronimo GreavesKyle  Talbot M.D.   On: 06/27/2021 19:23   CT Head Wo Contrast  Result  Date: 06/28/2021 CLINICAL DATA:  Headaches which are worsening EXAM: CT HEAD WITHOUT CONTRAST TECHNIQUE: Contiguous axial images were obtained from the base of the skull through the vertex without intravenous contrast. RADIATION DOSE REDUCTION: This exam was performed according to the departmental dose-optimization program which includes automated exposure control, adjustment of the mA and/or kV according to patient size and/or use of iterative reconstruction technique. COMPARISON:  None. FINDINGS: Brain: No  evidence of acute infarction, hemorrhage, hydrocephalus, extra-axial collection or mass lesion/mass effect. Vascular: No hyperdense vessel or unexpected calcification. Skull: No osseous abnormality. Sinuses/Orbits: Visualized paranasal sinuses are clear. Visualized mastoid sinuses are clear. Visualized orbits demonstrate no focal abnormality. Other: None IMPRESSION: No acute intracranial pathology. Electronically Signed   By: Elige Ko M.D.   On: 06/28/2021 12:08   DG Chest Portable 1 View  Result Date: 06/28/2021 CLINICAL DATA:  Hypertension.  Constipation.  Back pain and nausea. EXAM: PORTABLE CHEST 1 VIEW COMPARISON:  06/30/2020 FINDINGS: Artifact overlies the chest. Heart size is normal. There is aortic atherosclerosis and tortuosity. The pulmonary vascularity is normal. The lungs are clear. No effusions. IMPRESSION: No active disease.  Aortic atherosclerosis per Electronically Signed   By: Paulina Fusi M.D.   On: 06/28/2021 10:16    Procedures Procedures    Medications Ordered in ED Medications  amLODipine (NORVASC) tablet 5 mg (5 mg Oral Given 06/28/21 1028)  potassium chloride SA (KLOR-CON M) CR tablet 40 mEq (40 mEq Oral Given 06/28/21 1213)    ED Course/ Medical Decision Making/ A&P Clinical Course as of 06/29/21 1846  Wed Jun 29, 2021  1845 DG Chest Portable 1 View [RD]    Clinical Course User Index [RD] Gloris Manchester, MD                           Medical Decision Making Amount  and/or Complexity of Data Reviewed Labs: ordered. Radiology: ordered.  Risk Prescription drug management.   71 year old female with history of hypertension presents to the ED from urgent care due to concern of elevated blood pressures.  She initially presented to urgent care for help with constipation.  On arrival in the ED, patient's blood pressures were elevated in the range of 170s over 100s.  Prior to being bedded in the ED, initial diagnostic work-up was initiated.  Initial lab work is reassuring without any evidence of endorgan damage.  On my initial assessment, patient's blood pressure has improved.  She is well-appearing and currently does not endorse any symptoms.  Per chart review, she was endorsing dizziness and headache at time of urgent care visit.  CT head was ordered.  Patient's previous dose of amlodipine was given in the ED.  I spoke with the patient early on regarding management of constipation.  Patient was offered rectal exam for possible disimpaction.  Patient declined and stated that she would prefer to take medicine at home.  On patient's lab work, her potassium was slightly low at 3.0.  Replacing potassium was given in the ED.  Patient's troponin was mildly elevated which does appear to be her baseline based on chart review.  Patient has leukopenia which was present 6 months ago.  She has no evidence of urinary infection.  X-ray of chest and CT of head showed no acute findings.  Patient continued to remain asymptomatic while in the ED.  Patient was counseled on therapies to help with her constipation.  She was instructed on MiraLAX cleanout, to be done at home.  A new prescription was given to resume her daily amlodipine.  She was advised to keep her follow-up appointment in 2 weeks and to discuss current medications and possible needs for new medicines and/or changes in dosing for management of hypertension.  She was encouraged to return to the ED if she does have any worsening of  symptoms.  She was discharged in good condition.  Final Clinical Impression(s) / ED Diagnoses Final diagnoses:  Constipation, unspecified constipation type  Hypertension, unspecified type    Rx / DC Orders ED Discharge Orders          Ordered    amLODipine (NORVASC) 5 MG tablet  Daily        06/28/21 1421    polyethylene glycol (MIRALAX) 17 g packet  Daily        06/28/21 1421              Gloris Manchester, MD 06/29/21 1847

## 2021-07-01 ENCOUNTER — Telehealth: Payer: Self-pay | Admitting: Hematology and Oncology

## 2021-07-01 NOTE — Telephone Encounter (Signed)
R/s per 1/27 sch msg, pt has been called and confirmed appt

## 2021-07-06 ENCOUNTER — Ambulatory Visit: Payer: Medicaid - Out of State | Admitting: Hematology and Oncology

## 2021-07-06 ENCOUNTER — Other Ambulatory Visit: Payer: Medicaid - Out of State

## 2021-07-08 ENCOUNTER — Telehealth: Payer: Self-pay | Admitting: Hematology and Oncology

## 2021-07-08 NOTE — Telephone Encounter (Signed)
Called pt per pt req 2/3 inbasket, pt aware

## 2021-07-11 ENCOUNTER — Other Ambulatory Visit: Payer: Self-pay | Admitting: Hematology and Oncology

## 2021-07-11 DIAGNOSIS — E538 Deficiency of other specified B group vitamins: Secondary | ICD-10-CM

## 2021-07-12 ENCOUNTER — Inpatient Hospital Stay: Payer: Medicaid Other | Admitting: Hematology and Oncology

## 2021-07-12 ENCOUNTER — Inpatient Hospital Stay: Payer: Medicaid Other

## 2021-07-21 ENCOUNTER — Inpatient Hospital Stay: Payer: Medicaid Other

## 2021-07-21 ENCOUNTER — Other Ambulatory Visit: Payer: Self-pay | Admitting: Hematology and Oncology

## 2021-07-21 ENCOUNTER — Inpatient Hospital Stay: Payer: Medicaid Other | Admitting: Hematology and Oncology

## 2021-07-21 DIAGNOSIS — D708 Other neutropenia: Secondary | ICD-10-CM

## 2021-08-19 DIAGNOSIS — H268 Other specified cataract: Secondary | ICD-10-CM | POA: Diagnosis not present

## 2021-08-19 DIAGNOSIS — Z961 Presence of intraocular lens: Secondary | ICD-10-CM | POA: Diagnosis not present

## 2021-08-29 ENCOUNTER — Encounter: Payer: Self-pay | Admitting: Family

## 2021-09-04 NOTE — Progress Notes (Signed)
  This encounter was created in error - please disregard. No show 

## 2021-09-19 ENCOUNTER — Encounter: Payer: Medicaid Other | Admitting: Family

## 2021-09-21 NOTE — Progress Notes (Signed)
  This encounter was created in error - please disregard. No show 

## 2021-09-22 DIAGNOSIS — H268 Other specified cataract: Secondary | ICD-10-CM | POA: Diagnosis not present

## 2021-10-19 ENCOUNTER — Encounter: Payer: Self-pay | Admitting: Physician Assistant

## 2021-10-19 ENCOUNTER — Ambulatory Visit (INDEPENDENT_AMBULATORY_CARE_PROVIDER_SITE_OTHER): Payer: Medicaid Other | Admitting: Physician Assistant

## 2021-10-19 VITALS — BP 137/92 | HR 87 | Temp 98.6°F | Resp 18 | Ht 63.0 in | Wt 161.0 lb

## 2021-10-19 DIAGNOSIS — I10 Essential (primary) hypertension: Secondary | ICD-10-CM | POA: Diagnosis not present

## 2021-10-19 DIAGNOSIS — G8929 Other chronic pain: Secondary | ICD-10-CM

## 2021-10-19 DIAGNOSIS — R7989 Other specified abnormal findings of blood chemistry: Secondary | ICD-10-CM

## 2021-10-19 DIAGNOSIS — M25561 Pain in right knee: Secondary | ICD-10-CM | POA: Diagnosis not present

## 2021-10-19 DIAGNOSIS — Z13220 Encounter for screening for lipoid disorders: Secondary | ICD-10-CM | POA: Diagnosis not present

## 2021-10-19 DIAGNOSIS — G4700 Insomnia, unspecified: Secondary | ICD-10-CM

## 2021-10-19 DIAGNOSIS — E538 Deficiency of other specified B group vitamins: Secondary | ICD-10-CM

## 2021-10-19 DIAGNOSIS — E7800 Pure hypercholesterolemia, unspecified: Secondary | ICD-10-CM

## 2021-10-19 DIAGNOSIS — D709 Neutropenia, unspecified: Secondary | ICD-10-CM | POA: Diagnosis not present

## 2021-10-19 DIAGNOSIS — Z1231 Encounter for screening mammogram for malignant neoplasm of breast: Secondary | ICD-10-CM

## 2021-10-19 DIAGNOSIS — E876 Hypokalemia: Secondary | ICD-10-CM | POA: Diagnosis not present

## 2021-10-19 DIAGNOSIS — K219 Gastro-esophageal reflux disease without esophagitis: Secondary | ICD-10-CM | POA: Diagnosis not present

## 2021-10-19 DIAGNOSIS — E559 Vitamin D deficiency, unspecified: Secondary | ICD-10-CM

## 2021-10-19 MED ORDER — AMLODIPINE BESYLATE 5 MG PO TABS: 5.0000 mg | ORAL_TABLET | Freq: Every day | ORAL | 1 refills | Status: DC

## 2021-10-19 MED ORDER — TRAZODONE HCL 50 MG PO TABS: 50.0000 mg | ORAL_TABLET | Freq: Every evening | ORAL | 1 refills | Status: AC | PRN

## 2021-10-19 MED ORDER — MELOXICAM 7.5 MG PO TABS
7.5000 mg | ORAL_TABLET | Freq: Every day | ORAL | 0 refills | Status: AC
Start: 2021-10-19 — End: ?

## 2021-10-19 MED ORDER — OMEPRAZOLE 20 MG PO CPDR: 20.0000 mg | DELAYED_RELEASE_CAPSULE | Freq: Every day | ORAL | 3 refills | Status: AC

## 2021-10-19 NOTE — Progress Notes (Signed)
? ?New Patient Office Visit ? ?Subjective   ? ?Patient ID: Valerie Dunn, female    DOB: July 22, 1950  Age: 71 y.o. MRN: [id number] ? ?CC:  ?Chief Complaint  ?Patient presents with  ? New Patient (Initial Visit)  ? ? ?HPI ?Valerie Dunn states that she has been out of her blood pressure medication for the past 2 weeks.  States that she has been taking medication to help control her blood pressure for the last 23 years.  States that she does not check her blood pressure at home. ? ?States that she has been out of her trazodone for the past 6 to 7 months.  States that she has been taking this medication for 4 years.  States that she has difficulty falling asleep and staying asleep.  States that she has recently started taking Benadryl without relief of insomnia.  Does endorse good sleep hygiene. ? ?Reports previously diagnosed with vitamin B12 deficiency, states that she is not currently taking any supplementation. ? ?States that she has previously been seen by oncology for leukopenia, last office visit with them was in July 2022.  Note from that visit :  ?  ?# Leukopenia (Neutropenia) ?-- Findings are consistent with a mild neutropenia and ANC of 1.0 ?--Work-up to include nutritional studies with vitamin B12, folate, MMA, and homocystine ?--Viral work-up with hepatitis B, hepatitis C, and HIV ?--We will review a peripheral blood film ?--No indication for a bone marrow biopsy at this time.  This could be considered if the Sullivan continues to fall or there are other hematological abnormalities noted ?--Placeholder visit for return to clinic in 6 months time, though would likely have the patient return sooner if no clear abnormality can be found. ? ?States today that she has not had any follow-up with them. ? ?Also states that she suffers from chronic right knee pain, states that she is due to follow-up with orthopedics this month, states that they have previously stated she would need a procedure completed due to  continuously having fluid collected in her knee.  Denies any recent injury or trauma. ? ?Outpatient Encounter Medications as of 10/19/2021  ?Medication Sig  ? meloxicam (MOBIC) 7.5 MG tablet Take 1 tablet (7.5 mg total) by mouth daily.  ? omeprazole (PRILOSEC) 20 MG capsule Take 1 capsule (20 mg total) by mouth daily.  ? polyethylene glycol (MIRALAX) 17 g packet Take 17 g by mouth daily. For an initial cleanout dose, mix 5 packets in 32 ounces of water or Gatorade.  ? vitamin B-12 (CYANOCOBALAMIN) 1000 MCG tablet Take 1 tablet (1,000 mcg total) by mouth daily.  ? [DISCONTINUED] amLODipine (NORVASC) 5 MG tablet Take 1 tablet (5 mg total) by mouth daily.  ? [DISCONTINUED] traZODone (DESYREL) 50 MG tablet Take 50 mg by mouth at bedtime as needed for sleep.  ? amLODipine (NORVASC) 5 MG tablet Take 1 tablet (5 mg total) by mouth daily.  ? traZODone (DESYREL) 50 MG tablet Take 1 tablet (50 mg total) by mouth at bedtime as needed for sleep.  ? ?No facility-administered encounter medications on file as of 10/19/2021.  ? ? ?Past Medical History:  ?Diagnosis Date  ? Hypertension   ? ? ?No past surgical history on file. ? ?No family history on file. ? ?Social History  ? ?Socioeconomic History  ? Marital status: Single  ?  Spouse name: Not on file  ? Number of children: Not on file  ? Years of education: Not on file  ? Highest education level:  Not on file  ?Occupational History  ? Not on file  ?Tobacco Use  ? Smoking status: Never  ? Smokeless tobacco: Never  ?Substance and Sexual Activity  ? Alcohol use: Not Currently  ? Drug use: Not Currently  ? Sexual activity: Not on file  ?Other Topics Concern  ? Not on file  ?Social History Narrative  ? Not on file  ? ?Social Determinants of Health  ? ?Financial Resource Strain: Not on file  ?Food Insecurity: Not on file  ?Transportation Needs: Not on file  ?Physical Activity: Not on file  ?Stress: Not on file  ?Social Connections: Not on file  ?Intimate Partner Violence: Not on file   ? ? ?Review of Systems  ?Constitutional: Negative.   ?HENT: Negative.    ?Eyes: Negative.   ?Respiratory:  Negative for shortness of breath.   ?Cardiovascular:  Negative for chest pain.  ?Gastrointestinal: Negative.   ?Genitourinary: Negative.   ?Musculoskeletal:  Positive for joint pain and myalgias.  ?Skin: Negative.   ?Neurological: Negative.   ?Endo/Heme/Allergies: Negative.   ?Psychiatric/Behavioral:  Negative for depression. The patient has insomnia. The patient is not nervous/anxious.   ? ?  ? ? ?Objective   ? ?BP (!) 137/92 (BP Location: Right Arm, Patient Position: Sitting, Cuff Size: Normal)   Pulse 87   Temp 98.6 ?F (37 ?C) (Oral)   Resp 18   Ht _0  (1.6 m)   Wt 161 lb (73 kg)   SpO2 96%   BMI 28.52 kg/m?  ? ?Physical Exam ?Vitals and nursing note reviewed.  ?Constitutional:   ?   Appearance: Normal appearance.  ?HENT:  ?   Head: Normocephalic and atraumatic.  ?   Right Ear: External ear normal.  ?   Left Ear: External ear normal.  ?   Nose: Nose normal.  ?   Mouth/Throat:  ?   Mouth: Mucous membranes are moist.  ?   Pharynx: Oropharynx is clear.  ?Eyes:  ?   Extraocular Movements: Extraocular movements intact.  ?   Conjunctiva/sclera: Conjunctivae normal.  ?   Pupils: Pupils are equal, round, and reactive to light.  ?Cardiovascular:  ?   Rate and Rhythm: Normal rate and regular rhythm.  ?   Pulses: Normal pulses.     ?     Dorsalis pedis pulses are 2+ on the right side and 2+ on the left side.  ?     Posterior tibial pulses are 2+ on the right side and 2+ on the left side.  ?   Heart sounds: Normal heart sounds.  ?Pulmonary:  ?   Effort: Pulmonary effort is normal.  ?   Breath sounds: Normal breath sounds.  ?Musculoskeletal:  ?   Cervical back: Normal range of motion and neck supple.  ?   Right knee: Swelling and crepitus present. No tenderness.  ?   Left knee: Normal.  ?Skin: ?   General: Skin is warm and dry.  ?Neurological:  ?   General: No focal deficit present.  ?   Mental Status: She is  alert and oriented to person, place, and time.  ?Psychiatric:     ?   Mood and Affect: Mood normal.     ?   Behavior: Behavior normal.     ?   Thought Content: Thought content normal.     ?   Judgment: Judgment normal.  ? ?  ? ?Assessment & Plan:  ? ?Problem List Items Addressed This Visit   ?None ?Visit  Diagnoses   ? ? Hypokalemia    -  Primary  ? Thrombocytopenia ([place])      ? Essential hypertension      ? Relevant Medications  ? amLODipine (NORVASC) 5 MG tablet  ? Other Relevant Orders  ? CBC with Differential/Platelet  ? Comp. Metabolic Panel (12)  ? Vitamin D, 25-hydroxy  ? Insomnia, unspecified type      ? Relevant Medications  ? traZODone (DESYREL) 50 MG tablet  ? Other Relevant Orders  ? TSH  ? Chronic pain of right knee      ? Relevant Medications  ? meloxicam (MOBIC) 7.5 MG tablet  ? Gastroesophageal reflux disease without esophagitis      ? Relevant Medications  ? omeprazole (PRILOSEC) 20 MG capsule  ? Encounter for screening mammogram for malignant neoplasm of breast      ? Relevant Orders  ? MM 3D SCREEN BREAST BILATERAL  ? Screening, lipid      ? Relevant Orders  ? Lipid panel  ? B12 deficiency      ? Relevant Orders  ? Vitamin B12  ? ?  ? ?1. Essential hypertension ?Resume amlodipine.  Patient encouraged to check blood pressure at home, keep a written log and have available for all office visits. ? ?Patient given appointment to establish care [facility] at [facility].  Red flags given for prompt reevaluation. ?- amLODipine (NORVASC) 5 MG tablet; Take 1 tablet (5 mg total) by mouth daily.  Dispense: 90 tablet; Refill: 1 ?- CBC with Differential/Platelet ?- Comp. Metabolic Panel (12) ?- Vitamin D, 25-hydroxy ? ?2. Insomnia, unspecified type ?Resume trazodone.  Patient education given on good sleep hygiene ?- traZODone (DESYREL) 50 MG tablet; Take 1 tablet (50 mg total) by mouth at bedtime as needed for sleep.  Dispense: 30 tablet; Refill: 1 ?- TSH ? ?3. Chronic pain of right knee ?Trial Mobic, patient  encouraged to continue follow-up with orthopedics ?- meloxicam (MOBIC) 7.5 MG tablet; Take 1 tablet (7.5 mg total) by mouth daily.  Dispense: 30 tablet; Refill: 0 ? ?4. Gastroesophageal reflux disease without esophagitis ?Cornelius Moras

## 2021-10-19 NOTE — Progress Notes (Signed)
Patient has not eaten or taken medication ?Patient reports R knee and bilateral feet pain. ?Patient request refills for medications. Been out of BP medication for 2 weeks and trazodone for months. ? ? ?

## 2021-10-19 NOTE — Patient Instructions (Signed)
You are going to resume taking your amlodipine once daily.  I do encourage you to check your blood pressure at home, keep a written log and have available for all office visits. ? ?You are going to resume taking your trazodone at bedtime.  It is important that you aim for 7 hours of sleep a night. ? ?I encourage you to continue your follow-up with orthopedics for your knee pain.  To help with your pain, you will take Mobic once daily, I do encourage you to also take omeprazole on a daily basis to help protect your stomach. ? ?We will call you with today's lab results. ? ?Roney Jaffe, PA-C ?Physician Assistant ?[facility] ?[email or web address] ? ? ? ?Insomnia ?Insomnia is a sleep disorder that makes it difficult to fall asleep or stay asleep. Insomnia can cause fatigue, low energy, difficulty concentrating, mood swings, and poor performance at work or school. ?There are three different ways to classify insomnia: ?Difficulty falling asleep. ?Difficulty staying asleep. ?Waking up too early in the morning. ?Any type of insomnia can be long-term (chronic) or short-term (acute). Both are common. Short-term insomnia usually lasts for 3 months or less. Chronic insomnia occurs at least three times a week for longer than 3 months. ?What are the causes? ?Insomnia may be caused by another condition, situation, or substance, such as: ?Having certain mental health conditions, such as anxiety and depression. ?Using caffeine, alcohol, tobacco, or drugs. ?Having gastrointestinal conditions, such as gastroesophageal reflux disease (GERD). ?Having certain medical conditions. These include: ?Asthma. ?Alzheimer's disease. ?Stroke. ?Chronic pain. ?An overactive thyroid gland (hyperthyroidism). ?Other sleep disorders, such as restless legs syndrome and sleep apnea. ?Menopause. ?Sometimes, the cause of insomnia may not be known. ?What increases the risk? ?Risk factors for insomnia  include: ?Gender. Females are affected more often than males. ?Age. Insomnia is more common as people get older. ?Stress and certain medical and mental health conditions. ?Lack of exercise. ?Having an irregular work schedule. This may include working night shifts and traveling between different time zones. ?What are the signs or symptoms? ?If you have insomnia, the main symptom is having trouble falling asleep or having trouble staying asleep. This may lead to other symptoms, such as: ?Feeling tired or having low energy. ?Feeling nervous about going to sleep. ?Not feeling rested in the morning. ?Having trouble concentrating. ?Feeling irritable, anxious, or depressed. ?How is this diagnosed? ?This condition may be diagnosed based on: ?Your symptoms and medical history. Your health care provider may ask about: ?Your sleep habits. ?Any medical conditions you have. ?Your mental health. ?A physical exam. ?How is this treated? ?Treatment for insomnia depends on the cause. Treatment may focus on treating an underlying condition that is causing the insomnia. Treatment may also include: ?Medicines to help you sleep. ?Counseling or therapy. ?Lifestyle adjustments to help you sleep better. ?Follow these instructions at home: ?Eating and drinking ? ?Limit or avoid alcohol, caffeinated beverages, and products that contain nicotine and tobacco, especially close to bedtime. These can disrupt your sleep. ?Do not eat a large meal or eat spicy foods right before bedtime. This can lead to digestive discomfort that can make it hard for you to sleep. ?Sleep habits ? ?Keep a sleep diary to help you and your health care provider figure out what could be causing your insomnia. Write down: ?When you sleep. ?When you wake up during the night. ?How well you sleep and how rested you feel the next day. ?Any side effects of medicines you are  taking. ?What you eat and drink. ?Make your bedroom a dark, comfortable place where it is easy to fall  asleep. ?Put up shades or blackout curtains to block light from outside. ?Use a white noise machine to block noise. ?Keep the temperature cool. ?Limit screen use before bedtime. This includes: ?Not watching TV. ?Not using your smartphone, tablet, or computer. ?Stick to a routine that includes going to bed and waking up at the same times every day and night. This can help you fall asleep faster. Consider making a quiet activity, such as reading, part of your nighttime routine. ?Try to avoid taking naps during the day so that you sleep better at night. ?Get out of bed if you are still awake after 15 minutes of trying to sleep. Keep the lights down, but try reading or doing a quiet activity. When you feel sleepy, go back to bed. ?General instructions ?Take over-the-counter and prescription medicines only as told by your health care provider. ?Exercise regularly as told by your health care provider. However, avoid exercising in the hours right before bedtime. ?Use relaxation techniques to manage stress. Ask your health care provider to suggest some techniques that may work well for you. These may include: ?Breathing exercises. ?Routines to release muscle tension. ?Visualizing peaceful scenes. ?Make sure that you drive carefully. Do not drive if you feel very sleepy. ?Keep all follow-up visits. This is important. ?Contact a health care provider if: ?You are tired throughout the day. ?You have trouble in your daily routine due to sleepiness. ?You continue to have sleep problems, or your sleep problems get worse. ?Get help right away if: ?You have thoughts about hurting yourself or someone else. ?Get help right away if you feel like you may hurt yourself or others, or have thoughts about taking your own life. Go to your nearest emergency room or: ?Call 911. ?Call the National Suicide Prevention Lifeline at 613-212-0700 or 988. This is open 24 hours a day. ?Text the Crisis Text Line at 330-725-6170. ?Summary ?Insomnia is a  sleep disorder that makes it difficult to fall asleep or stay asleep. ?Insomnia can be long-term (chronic) or short-term (acute). ?Treatment for insomnia depends on the cause. Treatment may focus on treating an underlying condition that is causing the insomnia. ?Keep a sleep diary to help you and your health care provider figure out what could be causing your insomnia. ?This information is not intended to replace advice given to you by your health care provider. Make sure you discuss any questions you have with your health care provider. ?Document Revised: 05/02/2021 Document Reviewed: 05/02/2021 ?Elsevier Patient Education ? 2023 Elsevier Inc. ? ?

## 2021-10-20 DIAGNOSIS — R7989 Other specified abnormal findings of blood chemistry: Secondary | ICD-10-CM | POA: Insufficient documentation

## 2021-10-20 LAB — CBC WITH DIFFERENTIAL/PLATELET
Basophils Absolute: 0 10*3/uL (ref 0.0–0.2)
Basos: 0 %
EOS (ABSOLUTE): 0 10*3/uL (ref 0.0–0.4)
Eos: 2 %
Hematocrit: 39.5 % (ref 34.0–46.6)
Hemoglobin: 13.2 g/dL (ref 11.1–15.9)
Immature Grans (Abs): 0 10*3/uL (ref 0.0–0.1)
Immature Granulocytes: 0 %
Lymphocytes Absolute: 0.5 10*3/uL — ABNORMAL LOW (ref 0.7–3.1)
Lymphs: 21 %
MCH: 29.8 pg (ref 26.6–33.0)
MCHC: 33.4 g/dL (ref 31.5–35.7)
MCV: 89 fL (ref 79–97)
Monocytes Absolute: 0.5 10*3/uL (ref 0.1–0.9)
Monocytes: 22 %
Neutrophils Absolute: 1.3 10*3/uL — ABNORMAL LOW (ref 1.4–7.0)
Neutrophils: 55 %
Platelets: 311 10*3/uL (ref 150–450)
RBC: 4.43 x10E6/uL (ref 3.77–5.28)
RDW: 15 % (ref 11.7–15.4)
WBC: 2.4 10*3/uL — CL (ref 3.4–10.8)

## 2021-10-20 LAB — LIPID PANEL
Chol/HDL Ratio: 5.3 ratio — ABNORMAL HIGH (ref 0.0–4.4)
Cholesterol, Total: 158 mg/dL (ref 100–199)
HDL: 30 mg/dL — ABNORMAL LOW (ref >39)
LDL Chol Calc (NIH): 104 mg/dL — ABNORMAL HIGH (ref 0–99)
Triglycerides: 135 mg/dL (ref 0–149)
VLDL Cholesterol Cal: 24 mg/dL (ref 5–40)

## 2021-10-20 LAB — COMP. METABOLIC PANEL (12)
AST: 34 IU/L (ref 0–40)
Albumin/Globulin Ratio: 1 — ABNORMAL LOW (ref 1.2–2.2)
Albumin: 4.3 g/dL (ref 3.8–4.8)
Alkaline Phosphatase: 106 IU/L (ref 44–121)
BUN/Creatinine Ratio: 14 (ref 12–28)
BUN: 10 mg/dL (ref 8–27)
Bilirubin Total: 0.6 mg/dL (ref 0.0–1.2)
Calcium: 10 mg/dL (ref 8.7–10.3)
Chloride: 103 mmol/L (ref 96–106)
Creatinine, Ser: 0.74 mg/dL (ref 0.57–1.00)
Globulin, Total: 4.2 g/dL (ref 1.5–4.5)
Glucose: 91 mg/dL (ref 70–99)
Potassium: 3.6 mmol/L (ref 3.5–5.2)
Sodium: 139 mmol/L (ref 134–144)
Total Protein: 8.5 g/dL (ref 6.0–8.5)
eGFR: 88 mL/min/{1.73_m2} (ref >59)

## 2021-10-20 LAB — VITAMIN B12: Vitamin B-12: 214 pg/mL — ABNORMAL LOW (ref 232–1245)

## 2021-10-20 LAB — TSH: TSH: 5.1 u[IU]/mL — ABNORMAL HIGH (ref 0.450–4.500)

## 2021-10-20 LAB — VITAMIN D 25 HYDROXY (VIT D DEFICIENCY, FRACTURES): Vit D, 25-Hydroxy: 12 ng/mL — ABNORMAL LOW (ref 30.0–100.0)

## 2021-10-20 MED ORDER — VITAMIN D (ERGOCALCIFEROL) 1.25 MG (50000 UNIT) PO CAPS: 50000.0000 [IU] | ORAL_CAPSULE | ORAL | 2 refills | Status: AC

## 2021-10-20 MED ORDER — ATORVASTATIN CALCIUM 10 MG PO TABS: 10.0000 mg | ORAL_TABLET | Freq: Every day | ORAL | 1 refills | Status: DC

## 2021-10-20 MED ORDER — VITAMIN B-12 1000 MCG PO TABS: 1000.0000 ug | ORAL_TABLET | Freq: Every day | ORAL | 3 refills | Status: AC

## 2021-10-20 NOTE — Addendum Note (Signed)
Addended by: Roney Jaffe on: 10/20/2021 07:52 AM   Modules accepted: Orders

## 2021-10-21 ENCOUNTER — Telehealth: Payer: Self-pay | Admitting: Physician Assistant

## 2021-10-21 NOTE — Telephone Encounter (Signed)
Copied from CRM [id number]. Topic: General - Other >> Oct 21, 2021  4:02 PM Traci Sermon wrote: Reason for CRM: Pt called in stating she is needing to get her lab results, and requested a call back, please advise.

## 2021-10-21 NOTE — Telephone Encounter (Signed)
Pt given lab results per notes of Cari, PA on 10/21/21. Pt verbalized understanding and will go pick up medications. Also scheduled lab appt for 10/26/21 at 0900 for TSH recheck.

## 2021-10-24 ENCOUNTER — Ambulatory Visit: Payer: Self-pay

## 2021-10-24 NOTE — Telephone Encounter (Signed)
    Chief Complaint: Pt. States the Meloxicam "causing more leg pain than I had before."  Symptoms: Pain Frequency: Started when she started the meloxicam Pertinent Negatives: Patient denies  Disposition: [] ED /[] Urgent Care (no appt availability in office) / [] Appointment(In office/virtual)/ []  [facility]/ [] Home Care/ [] Refused Recommended Disposition /[] [facility]/ [x]  Follow-up with PCP Additional Notes: Pt. Asking if there is something else she have for pain.  Answer Assessment - Initial Assessment Questions 1. NAME of MEDICATION: "What medicine are you calling about?"     Meloxicam 2. QUESTION: "What is your question?" (e.g., double dose of medicine, side effect)     Causing more pain to both legs 3. PRESCRIBING HCP: "Who prescribed it?" Reason: if prescribed by specialist, call should be referred to that group.     [facility] 4. SYMPTOMS: "Do you have any symptoms?"     Pain 5. SEVERITY: If symptoms are present, ask "Are they mild, moderate or severe?"     Moderate 6. PREGNANCY:  "Is there any chance that you are pregnant?" "When was your last menstrual period?"     No  Protocols used: Medication Question Call-A-AH

## 2021-10-25 ENCOUNTER — Other Ambulatory Visit: Payer: Self-pay | Admitting: Physician Assistant

## 2021-10-25 DIAGNOSIS — G8929 Other chronic pain: Secondary | ICD-10-CM

## 2021-10-25 NOTE — Progress Notes (Signed)
Ref started to ortho  Roney Jaffe, PA-C Physician Assistant [facility] [email or web address]

## 2021-10-25 NOTE — Telephone Encounter (Signed)
Patient needs a referral.

## 2021-10-26 ENCOUNTER — Telehealth: Payer: Self-pay | Admitting: Family Medicine

## 2021-10-26 ENCOUNTER — Other Ambulatory Visit: Payer: Self-pay | Admitting: *Deleted

## 2021-10-26 ENCOUNTER — Other Ambulatory Visit: Payer: Medicaid Other

## 2021-10-26 DIAGNOSIS — R7989 Other specified abnormal findings of blood chemistry: Secondary | ICD-10-CM

## 2021-10-26 NOTE — Telephone Encounter (Signed)
Copied from CRM [id number]. Topic: General - Other >> Oct 26, 2021  3:11 PM Pawlus, Maxine Glenn A wrote: Reason for CRM: Pt called in to go over latest lab results, please advise.

## 2021-10-27 LAB — SPECIMEN STATUS REPORT

## 2021-10-27 LAB — THYROID PANEL WITH TSH
Free Thyroxine Index: 1.6 (ref 1.2–4.9)
T3 Uptake Ratio: 21 % — ABNORMAL LOW (ref 24–39)
T4, Total: 7.6 ug/dL (ref 4.5–12.0)
TSH: 3.59 u[IU]/mL (ref 0.450–4.500)

## 2021-10-28 ENCOUNTER — Ambulatory Visit: Payer: Self-pay | Admitting: *Deleted

## 2021-10-28 ENCOUNTER — Ambulatory Visit: Payer: Medicaid Other

## 2021-10-28 NOTE — Telephone Encounter (Signed)
Pt  returned the call and was given the lab result messages from [facility], PA-C. 10/20/2021 7:52 AM  and 10/27/2021 at 9:19 AM and 10/20/2021 at 7:52 AM all read to her.   I answered her many questions by looking in her chart.      She is going to call Dr. Jonny Ruiz Dorsey's office (Oncology) and make an appt.   (I looked in her chart and gave her the name of the oncology dr.   She did not remember going to him).     She picked up the rx for atorvastatin and Vitamin D.   I let her know the vitamin B12 was available OTC is why the pharmacy didn't have an rx for it.   Reason for Disposition  [1] Follow-up call to recent contact AND [2] information only call, no triage required  Answer Assessment - Initial Assessment Questions 1. REASON FOR CALL or QUESTION: "What is your reason for calling today?" or "How can I best help you?" or "What question do you have that I can help answer?"     Read lab result messages to her.  Protocols used: Information Only Call - No Triage-A-AH

## 2021-11-03 ENCOUNTER — Ambulatory Visit: Payer: Medicaid Other

## 2021-11-03 ENCOUNTER — Ambulatory Visit: Payer: Medicaid Other | Admitting: Physician Assistant

## 2021-11-07 ENCOUNTER — Ambulatory Visit (INDEPENDENT_AMBULATORY_CARE_PROVIDER_SITE_OTHER): Payer: Medicaid Other | Admitting: Physician Assistant

## 2021-11-07 ENCOUNTER — Ambulatory Visit (INDEPENDENT_AMBULATORY_CARE_PROVIDER_SITE_OTHER): Payer: Medicaid Other

## 2021-11-07 ENCOUNTER — Encounter: Payer: Self-pay | Admitting: Physician Assistant

## 2021-11-07 VITALS — Ht 63.0 in | Wt 163.4 lb

## 2021-11-07 DIAGNOSIS — G8929 Other chronic pain: Secondary | ICD-10-CM

## 2021-11-07 DIAGNOSIS — M25561 Pain in right knee: Secondary | ICD-10-CM | POA: Diagnosis not present

## 2021-11-07 NOTE — Addendum Note (Signed)
Addended by: Polly Cobia on: 11/07/2021 09:11 AM   Modules accepted: Orders

## 2021-11-07 NOTE — Progress Notes (Signed)
Office Visit Note   Patient: Valerie Dunn           Date of Birth: 06-19-1950           MRN: [id number] Visit Date: 11/07/2021              Requested by: Mayers, Kasandra Knudsen, PA-C [address] [place],  [place] [address] PCP: Georganna Skeans, MD  Chief Complaint  Patient presents with   Right Knee - New Patient (Initial Visit)      HPI: Patient is a pleasant 71 year old woman with a long history of right knee pain.  She has had no previous surgeries.  She says the pain is most noticed going downstairs pain is not constant.  [place].  She is not asking for any pain medication today.  She her last injection was about a month ago  Assessment & Plan: Visit Diagnoses:  1. 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  We will send her to physical therapy for quadricep strengthening.  Talked her about Voltaren gel.  She has trouble with oral anti-inflammatories as she has had some stomach upset  Follow-Up Instructions: No follow-ups on file.   Ortho Exam  Patient is alert, oriented, no adenopathy, well-dressed, normal affect, normal respiratory effort. Examination of her right knee she has no effusion no redness no warmth.  She does have a valgus alignment.  More tender over the lateral and patellofemoral joint not really tender over the medial joint.  Good varus valgus stability  Imaging: XR KNEE 3 VIEW RIGHT  Result Date: 11/07/2021 Radiographs of her right knee in 3 projections were reviewed today.  She does have some lateral joint space narrowing and valgus malalignment.  Also some patella alto and sclerotic changes and decreased joint space in the patellofemoral joint  No images are attached to the encounter.  Labs: Lab Results  Component Value Date   ESRSEDRATE 26 (H) 12/22/2020     Lab Results  Component Value Date   ALBUMIN  4.3 10/19/2021   ALBUMIN 4.1 06/27/2021   ALBUMIN 4.2 12/22/2020    Lab Results  Component Value Date   MG 1.9 06/28/2021   Lab Results  Component Value Date   VD25OH 12.0 (L) 10/19/2021    No results found for: PREALBUMIN    Latest Ref Rng & Units 10/19/2021   10:26 AM 06/27/2021    6:50 PM 12/22/2020    2:25 PM  CBC EXTENDED  WBC 3.4 - 10.8 x10E3/uL 2.4   2.9   3.0    RBC 3.77 - 5.28 x10E6/uL 4.43   5.03   4.24    Hemoglobin 11.1 - 15.9 g/dL 17.4   94.4   96.7    HCT 34.0 - 46.6 % 39.5   43.3   37.4    Platelets 150 - 450 x10E3/uL 311   298   325    NEUT# 1.4 - 7.0 x10E3/uL 1.3    1.5    Lymph# 0.7 - 3.1 x10E3/uL 0.5    0.6       Body mass index is 28.95 kg/m.  Orders:  Orders Placed This Encounter  Procedures   XR KNEE 3 VIEW RIGHT   No orders of the defined types were placed in this encounter.    Procedures: No procedures performed  Clinical Data: No additional findings.  ROS:  All other systems negative, except as noted in the HPI. Review of Systems  Objective: Vital Signs: Ht 5\' 3"  (1.6 m)   Wt 163 lb 6.4 oz (74.1 kg)   BMI 28.95 kg/m   Specialty Comments:  No specialty comments available.  PMFS History: Patient Active Problem List   Diagnosis Date Noted   Abnormal thyroid blood test 10/20/2021   Essential hypertension 10/19/2021   Insomnia 10/19/2021   Pain in right knee 10/19/2021   Gastroesophageal reflux disease without esophagitis 10/19/2021   Hypokalemia 10/19/2021   Neutropenia (HCC) 10/19/2021   B12 deficiency 10/19/2021   Past Medical History:  Diagnosis Date   Hypertension     History reviewed. No pertinent family history.  History reviewed. No pertinent surgical history. Social History   Occupational History   Not on file  Tobacco Use   Smoking status: Never   Smokeless tobacco: Never  Substance and Sexual Activity   Alcohol use: Not Currently   Drug use: Not Currently   Sexual activity: Not on file

## 2021-11-09 ENCOUNTER — Inpatient Hospital Stay: Payer: Medicare Other | Attending: Hematology and Oncology

## 2021-11-09 ENCOUNTER — Other Ambulatory Visit: Payer: Self-pay

## 2021-11-09 ENCOUNTER — Inpatient Hospital Stay (HOSPITAL_BASED_OUTPATIENT_CLINIC_OR_DEPARTMENT_OTHER): Payer: Medicare Other | Admitting: Hematology and Oncology

## 2021-11-09 VITALS — BP 170/92 | HR 68 | Temp 97.8°F | Resp 16 | Wt 164.3 lb

## 2021-11-09 DIAGNOSIS — D708 Other neutropenia: Secondary | ICD-10-CM

## 2021-11-09 DIAGNOSIS — E538 Deficiency of other specified B group vitamins: Secondary | ICD-10-CM

## 2021-11-09 DIAGNOSIS — D709 Neutropenia, unspecified: Secondary | ICD-10-CM | POA: Diagnosis present

## 2021-11-09 LAB — CMP (CANCER CENTER ONLY)
ALT: 30 U/L (ref 0–44)
AST: 33 U/L (ref 15–41)
Albumin: 4 g/dL (ref 3.5–5.0)
Alkaline Phosphatase: 91 U/L (ref 38–126)
Anion gap: 5 (ref 5–15)
BUN: 9 mg/dL (ref 8–23)
CO2: 29 mmol/L (ref 22–32)
Calcium: 9.9 mg/dL (ref 8.9–10.3)
Chloride: 106 mmol/L (ref 98–111)
Creatinine: 0.62 mg/dL (ref 0.44–1.00)
GFR, Estimated: >60 mL/min (ref >60)
Glucose, Bld: 117 mg/dL — ABNORMAL HIGH (ref 70–99)
Potassium: 3 mmol/L — ABNORMAL LOW (ref 3.5–5.1)
Sodium: 140 mmol/L (ref 135–145)
Total Bilirubin: 0.6 mg/dL (ref 0.3–1.2)
Total Protein: 8.5 g/dL — ABNORMAL HIGH (ref 6.5–8.1)

## 2021-11-09 LAB — CBC WITH DIFFERENTIAL (CANCER CENTER ONLY)
Abs Immature Granulocytes: 0 10*3/uL (ref 0.00–0.07)
Basophils Absolute: 0 10*3/uL (ref 0.0–0.1)
Basophils Relative: 0 %
Eosinophils Absolute: 0.1 10*3/uL (ref 0.0–0.5)
Eosinophils Relative: 4 %
HCT: 37 % (ref 36.0–46.0)
Hemoglobin: 12.5 g/dL (ref 12.0–15.0)
Immature Granulocytes: 0 %
Lymphocytes Relative: 15 %
Lymphs Abs: 0.4 10*3/uL — ABNORMAL LOW (ref 0.7–4.0)
MCH: 29.6 pg (ref 26.0–34.0)
MCHC: 33.8 g/dL (ref 30.0–36.0)
MCV: 87.5 fL (ref 80.0–100.0)
Monocytes Absolute: 0.6 10*3/uL (ref 0.1–1.0)
Monocytes Relative: 23 %
Neutro Abs: 1.5 10*3/uL — ABNORMAL LOW (ref 1.7–7.7)
Neutrophils Relative %: 58 %
Platelet Count: 273 10*3/uL (ref 150–400)
RBC: 4.23 MIL/uL (ref 3.87–5.11)
RDW: 14.7 % (ref 11.5–15.5)
WBC Count: 2.6 10*3/uL — ABNORMAL LOW (ref 4.0–10.5)
nRBC: 0 % (ref 0.0–0.2)

## 2021-11-09 LAB — HEPATITIS B SURFACE ANTIBODY,QUALITATIVE: Hep B S Ab: REACTIVE — AB

## 2021-11-09 NOTE — Progress Notes (Signed)
[place] [phone]   [phone]  PROGRESS NOTE  Patient Care Team: Dorna Mai, MD as PCP - General (Family Medicine)  Hematological/Oncological History # Leukopenia (Neutropenia) 06/30/2020: WBC 15.0, Hgb 11.3, MCV 94.6, Plt 307 11/30/2020: WBC 2.4, Hgb 12.6, MCV 89, Plt 339, ANC 1.0, Lymph 0.6 12/22/2020: establish care with Dr. Lorenso Courier   Interval History:  Valerie Dunn 71 y.o. female with medical history significant for mild neutropenia who presents for a follow up visit. The patient's last visit was on 12/22/2020 at which time she established care. In the interim since the last visit she was lost to follow up but presents today to re-establish care.   On exam today Valerie Dunn has been well overall interim since her last visit.  She reports that she did have a pneumonia back in June of last year where she required antibiotics.  She has had no further infections.  She denies any urine tract infections, sinus infections, or rashes.  She notes that she has been gaining weight.  She also is concerned about her elevated blood pressure today.  She notes she is taking amlodipine and does occasionally feel lightheaded or dizzy when she stands up quickly.  She reports that on occasion she does see some bright red blood in her stool and she struggles with constipation.  She denies any pain with her bowel movements.  She reports that she also recently had an encounter with bedbugs in a friend's house.  She currently denies any fevers, chills, sweats, nausea, vomiting or diarrhea.  Full 10 point ROS is listed below.  MEDICAL HISTORY:  Past Medical History:  Diagnosis Date   Hypertension     SURGICAL HISTORY: No past surgical history on file.  SOCIAL HISTORY: Social History   Socioeconomic History   Marital status: Single    Spouse name: Not on file   Number of children: Not on file   Years of education: Not on file   Highest education level:  Not on file  Occupational History   Not on file  Tobacco Use   Smoking status: Never   Smokeless tobacco: Never  Substance and Sexual Activity   Alcohol use: Not Currently   Drug use: Not Currently   Sexual activity: Not on file  Other Topics Concern   Not on file  Social History Narrative   Not on file   Social Determinants of Health   Financial Resource Strain: Not on file  Food Insecurity: Not on file  Transportation Needs: Not on file  Physical Activity: Not on file  Stress: Not on file  Social Connections: Not on file  Intimate Partner Violence: Not on file    FAMILY HISTORY: No family history on file.  ALLERGIES:  is allergic to sulfa antibiotics.  MEDICATIONS:  Current Outpatient Medications  Medication Sig Dispense Refill   amLODipine (NORVASC) 5 MG tablet Take 1 tablet (5 mg total) by mouth daily. 90 tablet 1   atorvastatin (LIPITOR) 10 MG tablet Take 1 tablet (10 mg total) by mouth daily. 90 tablet 1   meloxicam (MOBIC) 7.5 MG tablet Take 1 tablet (7.5 mg total) by mouth daily. 30 tablet 0   omeprazole (PRILOSEC) 20 MG capsule Take 1 capsule (20 mg total) by mouth daily. 30 capsule 3   polyethylene glycol (MIRALAX) 17 g packet Take 17 g by mouth daily. For an initial cleanout dose, mix 5 packets in 32 ounces of water or Gatorade. 100 each 0   traZODone (  DESYREL) 50 MG tablet Take 1 tablet (50 mg total) by mouth at bedtime as needed for sleep. 30 tablet 1   vitamin B-12 (CYANOCOBALAMIN) 1000 MCG tablet Take 1 tablet (1,000 mcg total) by mouth daily. 30 tablet 3   Vitamin D, Ergocalciferol, (DRISDOL) 1.25 MG (50000 UNIT) CAPS capsule Take 1 capsule (50,000 Units total) by mouth every 7 (seven) days. 4 capsule 2   No current facility-administered medications for this visit.    REVIEW OF SYSTEMS:   Constitutional: ( - ) fevers, ( - )  chills , ( - ) night sweats Eyes: ( - ) blurriness of vision, ( - ) double vision, ( - ) watery eyes Ears, nose, mouth, throat,  and face: ( - ) mucositis, ( - ) sore throat Respiratory: ( - ) cough, ( - ) dyspnea, ( - ) wheezes Cardiovascular: ( - ) palpitation, ( - ) chest discomfort, ( - ) lower extremity swelling Gastrointestinal:  ( - ) nausea, ( - ) heartburn, ( - ) change in bowel habits Skin: ( - ) abnormal skin rashes Lymphatics: ( - ) new lymphadenopathy, ( - ) easy bruising Neurological: ( - ) numbness, ( - ) tingling, ( - ) new weaknesses Behavioral/Psych: ( - ) mood change, ( - ) new changes  All other systems were reviewed with the patient and are negative.  PHYSICAL EXAMINATION:  Vitals:   11/09/21 1005  BP: (!) 170/92  Pulse: 68  Resp: 16  Temp: 97.8 F (36.6 C)  SpO2: 100%   Filed Weights   11/09/21 1005  Weight: 164 lb 4.8 oz (74.5 kg)    GENERAL: Well-appearing elderly African-American female, alert, no distress and comfortable SKIN: skin color, texture, turgor are normal, no rashes or significant lesions EYES: conjunctiva are pink and non-injected, sclera clear LUNGS: clear to auscultation and percussion with normal breathing effort HEART: regular rate & rhythm and no murmurs and no lower extremity edema Musculoskeletal: no cyanosis of digits and no clubbing  PSYCH: alert & oriented x 3, fluent speech NEURO: no focal motor/sensory deficits  LABORATORY DATA:  I have reviewed the data as listed    Latest Ref Rng & Units 11/09/2021    9:07 AM 10/19/2021   10:26 AM 06/27/2021    6:50 PM  CBC  WBC 4.0 - 10.5 K/uL 2.6   2.4   2.9    Hemoglobin 12.0 - 15.0 g/dL 12.5   13.2   14.0    Hematocrit 36.0 - 46.0 % 37.0   39.5   43.3    Platelets 150 - 400 K/uL 273   311   298         Latest Ref Rng & Units 11/09/2021    9:07 AM 10/19/2021   10:26 AM 06/27/2021    6:50 PM  CMP  Glucose 70 - 99 mg/dL 117   91   102    BUN 8 - 23 mg/dL 9   10   <5    Creatinine 0.44 - 1.00 mg/dL 0.62   0.74   0.69    Sodium 135 - 145 mmol/L 140   139   138    Potassium 3.5 - 5.1 mmol/L 3.0   3.6   3.0     Chloride 98 - 111 mmol/L 106   103   103    CO2 22 - 32 mmol/L 29    26    Calcium 8.9 - 10.3 mg/dL 9.9   10.0  9.6    Total Protein 6.5 - 8.1 g/dL 8.5   8.5   9.1    Total Bilirubin 0.3 - 1.2 mg/dL 0.6   0.6   0.9    Alkaline Phos 38 - 126 U/L 91   106   104    AST 15 - 41 U/L 33   34   26    ALT 0 - 44 U/L 30    18      RADIOGRAPHIC STUDIES: XR KNEE 3 VIEW RIGHT  Result Date: 11/07/2021 Radiographs of her right knee in 3 projections were reviewed today.  She does have some lateral joint space narrowing and valgus malalignment.  Also some patella alto and sclerotic changes and decreased joint space in the patellofemoral joint   ASSESSMENT & PLAN Valerie Dunn 71 y.o. female with medical history significant for mild neutropenia who presents for a follow up visit.   After review of the labs, review of the records, and discussion with the patient the patients findings are most consistent with a mild neutropenia, potentially representing benign ethnic neutropenia.  In order to rule out other etiologies we will do a full nutritional work-up, viral work-up, and review of the peripheral blood film.  There is no clear indication for a bone marrow biopsy at this time.  Given the relative stability of her findings and the lack of any infectious symptoms I do not believe that her lower white blood cell count is of clinical significance.   # Leukopenia (Neutropenia) -- Findings are consistent with a mild neutropenia and ANC between 1.0-1.5 --negative prior work-up including nutritional studies with vitamin B12, folate negative. Today will check MMA --Viral work-up showed no evidence of hepatitis C and HIV, however Hep B core antibody was positive. Will check surface antibody today (surface antigen was negative).  --No indication for a bone marrow biopsy at this time.  This could be considered if the [place] continues to fall or there are other hematological abnormalities noted --US liver/spleen to assess  for liver disease as the cause of her neutropenia.  --RTC PRN.   Orders Placed This Encounter  Procedures   US Abdomen Complete    Standing Status:   Future    Standing Expiration Date:   11/09/2022    Order Specific Question:   Reason for Exam (SYMPTOM  OR DIAGNOSIS REQUIRED)    Answer:   concern for liver disease/splenomegaly    Order Specific Question:   Preferred imaging location?    Answer:   [facility]    All questions were answered. The patient knows to call the clinic with any problems, questions or concerns.  A total of more than 30 minutes were spent on this encounter with face-to-face time and non-face-to-face time, including preparing to see the patient, ordering tests and/or medications, counseling the patient and coordination of care as outlined above.   Ledell Peoples, MD Department of Hematology/Oncology [place] [facility] Phone: [phone] Pager: [phone] Email: [email or web address]  11/09/2021 10:32 AM

## 2021-11-10 ENCOUNTER — Telehealth: Payer: Self-pay

## 2021-11-10 NOTE — Telephone Encounter (Signed)
-----   Message from Valerie Dunn, [place] sent at 10/27/2021  9:19 AM EDT ----- Please call patient and let her know that her thyroid function is back within normal limits, she does have a decreased T3, I do encourage continuing to monitor this with her primary care provider.

## 2021-11-11 LAB — METHYLMALONIC ACID, SERUM: Methylmalonic Acid, Quantitative: 206 nmol/L (ref 0–378)

## 2021-11-14 ENCOUNTER — Ambulatory Visit: Payer: Medicare Other | Attending: Physician Assistant | Admitting: Physical Therapy

## 2021-11-14 DIAGNOSIS — G8929 Other chronic pain: Secondary | ICD-10-CM | POA: Insufficient documentation

## 2021-11-14 DIAGNOSIS — M6281 Muscle weakness (generalized): Secondary | ICD-10-CM | POA: Diagnosis present

## 2021-11-14 DIAGNOSIS — R600 Localized edema: Secondary | ICD-10-CM | POA: Diagnosis present

## 2021-11-14 DIAGNOSIS — M25561 Pain in right knee: Secondary | ICD-10-CM | POA: Insufficient documentation
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
## 2021-11-15 ENCOUNTER — Ambulatory Visit
Admission: RE | Admit: 2021-11-15 | Discharge: 2021-11-15 | Disposition: A | Discharge status: Home / Self Care | Payer: Medicaid Other | Source: Ambulatory Visit | Attending: Physician Assistant | Admitting: Physician Assistant

## 2021-11-15 DIAGNOSIS — Z1231 Encounter for screening mammogram for malignant neoplasm of breast: Secondary | ICD-10-CM

## 2021-11-18 ENCOUNTER — Encounter: Payer: Self-pay | Admitting: Family Medicine

## 2021-11-18 ENCOUNTER — Ambulatory Visit (INDEPENDENT_AMBULATORY_CARE_PROVIDER_SITE_OTHER): Payer: Medicaid Other | Admitting: Family Medicine

## 2021-11-18 DIAGNOSIS — E7800 Pure hypercholesterolemia, unspecified: Secondary | ICD-10-CM | POA: Diagnosis not present

## 2021-11-18 DIAGNOSIS — I10 Essential (primary) hypertension: Secondary | ICD-10-CM

## 2021-11-18 MED ORDER — ATORVASTATIN CALCIUM 10 MG PO TABS: 10.0000 mg | ORAL_TABLET | Freq: Every day | ORAL | 1 refills | Status: AC

## 2021-11-18 MED ORDER — AMLODIPINE BESYLATE 5 MG PO TABS: 5.0000 mg | ORAL_TABLET | Freq: Every day | ORAL | 1 refills | Status: AC

## 2021-11-18 NOTE — Progress Notes (Signed)
Patient is here for f/u hypertension Patient has no new concerns for provider today

## 2021-11-18 NOTE — Progress Notes (Signed)
Established Patient Office Visit  Subjective    Patient ID: Valerie Dunn, female    DOB: [birth year]  Age: 71 y.o. MRN: [id number]  CC:  Chief Complaint  Patient presents with   Annual Exam    HPI Valerie Dunn presents for follow up of hypertension. Patient denies acute complaints or concerns.    Outpatient Encounter Medications as of 11/18/2021  Medication Sig   meloxicam (MOBIC) 7.5 MG tablet Take 1 tablet (7.5 mg total) by mouth daily.   omeprazole (PRILOSEC) 20 MG capsule Take 1 capsule (20 mg total) by mouth daily.   polyethylene glycol (MIRALAX) 17 g packet Take 17 g by mouth daily. For an initial cleanout dose, mix 5 packets in 32 ounces of water or Gatorade.   traZODone (DESYREL) 50 MG tablet Take 1 tablet (50 mg total) by mouth at bedtime as needed for sleep.   vitamin B-12 (CYANOCOBALAMIN) 1000 MCG tablet Take 1 tablet (1,000 mcg total) by mouth daily.   Vitamin D, Ergocalciferol, (DRISDOL) 1.25 MG (50000 UNIT) CAPS capsule Take 1 capsule (50,000 Units total) by mouth every 7 (seven) days.   [DISCONTINUED] amLODipine (NORVASC) 5 MG tablet Take 1 tablet (5 mg total) by mouth daily.   [DISCONTINUED] atorvastatin (LIPITOR) 10 MG tablet Take 1 tablet (10 mg total) by mouth daily.   amLODipine (NORVASC) 5 MG tablet Take 1 tablet (5 mg total) by mouth daily.   atorvastatin (LIPITOR) 10 MG tablet Take 1 tablet (10 mg total) by mouth daily.   No facility-administered encounter medications on file as of 11/18/2021.    Past Medical History:  Diagnosis Date   Hypertension     No past surgical history on file.  No family history on file.  Social History   Socioeconomic History   Marital status: Single    Spouse name: Not on file   Number of children: Not on file   Years of education: Not on file   Highest education level: Not on file  Occupational History   Not on file  Tobacco Use   Smoking status: Never   Smokeless tobacco: Never  Substance and Sexual Activity    Alcohol use: Not Currently   Drug use: Not Currently   Sexual activity: Not on file  Other Topics Concern   Not on file  Social History Narrative   Not on file   Social Determinants of Health   Financial Resource Strain: Not on file  Food Insecurity: Not on file  Transportation Needs: Not on file  Physical Activity: Not on file  Stress: Not on file  Social Connections: Not on file  Intimate Partner Violence: Not on file    Review of Systems  All other systems reviewed and are negative.       Objective    BP 124/83   Pulse 81   Temp 98 F (36.7 C) (Oral)   Resp 16   Ht 5\' 3"  (1.6 m)   Wt 162 lb 6.4 oz (73.7 kg)   SpO2 98%   BMI 28.77 kg/m   Physical Exam Vitals and nursing note reviewed.  Constitutional:      General: She is not in acute distress. Cardiovascular:     Rate and Rhythm: Normal rate and regular rhythm.  Pulmonary:     Effort: Pulmonary effort is normal.     Breath sounds: Normal breath sounds.  Abdominal:     Palpations: Abdomen is soft.     Tenderness: There is no abdominal tenderness.  Musculoskeletal:  Right lower leg: No edema.     Left lower leg: No edema.  Neurological:     General: No focal deficit present.     Mental Status: She is alert and oriented to person, place, and time.         Assessment & Plan:   1. Essential hypertension Appears stable with present management. Continue and monitor. Meds refilled. - amLODipine (NORVASC) 5 MG tablet; Take 1 tablet (5 mg total) by mouth daily.  Dispense: 90 tablet; Refill: 1  2. Elevated LDL cholesterol level Continue present management. Meds refilled.  - atorvastatin (LIPITOR) 10 MG tablet; Take 1 tablet (10 mg total) by mouth daily.  Dispense: 90 tablet; Refill: 1  Return in about 4 months (around 03/20/2022) for physical.   Tommie Raymond, MD

## 2021-11-22 ENCOUNTER — Ambulatory Visit (HOSPITAL_COMMUNITY)
Admission: RE | Admit: 2021-11-22 | Discharge: 2021-11-22 | Disposition: A | Discharge status: Home / Self Care | Payer: Medicare Other | Source: Ambulatory Visit | Attending: Hematology and Oncology | Admitting: Hematology and Oncology

## 2021-11-22 DIAGNOSIS — D709 Neutropenia, unspecified: Secondary | ICD-10-CM | POA: Diagnosis present

## 2021-11-25 ENCOUNTER — Ambulatory Visit: Payer: Medicare Other | Admitting: Physical Therapy

## 2021-11-30 ENCOUNTER — Ambulatory Visit: Payer: Medicare Other | Admitting: Physical Therapy

## 2021-11-30 ENCOUNTER — Telehealth: Payer: Self-pay | Admitting: Physical Therapy

## 2021-11-30 NOTE — Therapy (Deleted)
OUTPATIENT PHYSICAL THERAPY TREATMENT NOTE   Patient Name: Valerie Dunn MRN: [id number] DOB:02-Jun-1951, 71 y.o., female Today's Date: 11/30/2021  PCP: Marland Kitchen REFERRING PROVIDER: ***  END OF SESSION:    Past Medical History:  Diagnosis Date   Hypertension    No past surgical history on file. Patient Active Problem List   Diagnosis Date Noted   Abnormal thyroid blood test 10/20/2021   Essential hypertension 10/19/2021   Insomnia 10/19/2021   Pain in right knee 10/19/2021   Gastroesophageal reflux disease without esophagitis 10/19/2021   Hypokalemia 10/19/2021   Neutropenia (HCC) 10/19/2021   B12 deficiency 10/19/2021    REFERRING DIAG: ***  THERAPY DIAG:  No diagnosis found.  Rationale for Evaluation and Treatment {HABREHAB:27488}  PERTINENT HISTORY: ***  PRECAUTIONS: ***  SUBJECTIVE: ***  PAIN:  Are you having pain? {OPRCPAIN:27236}   OBJECTIVE: (objective measures completed at initial evaluation unless otherwise dated)  DIAGNOSTIC FINDINGS: XR KNEE 3 VIEW RIGHT   Result Date: 11/07/2021 Radiographs of her right knee in 3 projections were reviewed today.  She does have some lateral joint space narrowing and valgus malalignment.  Also some patella alto and sclerotic changes and decreased joint space in the patellofemoral joint  No images are attached to the encounter.   PATIENT SURVEYS:  FOTO NT MCD LEFS not done   COGNITION:           Overall cognitive status: Within functional limits for tasks assessed                          SENSATION: WFL   EDEMA:  Circumferential: Rt 16 3/4 inch, Lt. 15 3/4 inch    POSTURE:  genu valgus   PALPATION: Tender distal lateral hamstring, peripatellar and lateral joint line.  Significant pocket of swelling suprapatellar area.    LOWER EXTREMITY ROM:   Active ROM Right eval Left eval  Hip flexion      Hip extension      Hip abduction      Hip adduction      Hip internal rotation      Hip external rotation       Knee flexion 120 130  Knee extension 10 5  Ankle dorsiflexion      Ankle plantarflexion      Ankle inversion      Ankle eversion       (Blank rows = not tested)   LOWER EXTREMITY MMT:   MMT Right eval Left eval  Hip flexion 4+ 4+  Hip extension      Hip abduction 3 3  Hip adduction      Hip internal rotation      Hip external rotation      Knee flexion 5 5  Knee extension 5 5  Ankle dorsiflexion 5 5  Ankle plantarflexion      Ankle inversion      Ankle eversion       (Blank rows = not tested)   LOWER EXTREMITY SPECIAL TESTS:  Knee special tests: NT   FUNCTIONAL TESTS:  5 times sit to stand: 11 sec    GAIT: Distance walked: 349 feet Assistive device utilized: None Level of assistance: Complete Independence Comments: 2 min walk test        TODAY'S TREATMENT: PT eval, HEP established, general walking and exercise guidelines     PATIENT EDUCATION:  Education details: See above  Person educated: Patient Education method: Explanation, Demonstration, Verbal cues, and Handouts Education  comprehension: verbalized understanding and needs further education     HOME EXERCISE PROGRAM: Access Code: [email or web address] URL: [email or web address] Date: 11/14/2021 Prepared by: Karie Mainland   Exercises - Supine Quad Set  - 2 x daily - 7 x weekly - 2 sets - 10 reps - 5 hold - Supine Active Straight Leg Raise  - 2 x daily - 7 x weekly - 2-3 sets - 10 reps - 5 hold - Supine Bridge  - 2 x daily - 7 x weekly - 2-3 sets - 10 reps - 5 hold - Supine Hamstring Stretch with Strap  - 2 x daily - 7 x weekly - 1-2 sets - 3 reps - 30 hold - Beginner Side Leg Lift  - 2 x daily - 7 x weekly - 2-3 sets - 10 reps - 5 hold   ASSESSMENT:   CLINICAL IMPRESSION: Patient is a 71 y.o. female who was seen today for physical therapy evaluation and treatment for R knee pain.  She has overall good AROM and strength but with a fair amount of swelling above her patella and weakness in  bilateral hips.  A reduction in activity level over the past year may contribute to her recent increase in pain and per her report, weight gain.  She is motivated to begin a walking program again and hopefull therapy can provide that inspiration for her.       OBJECTIVE IMPAIRMENTS decreased mobility, difficulty walking, decreased ROM, decreased strength, increased edema, increased fascial restrictions, impaired flexibility, postural dysfunction, and pain.    ACTIVITY LIMITATIONS standing, squatting, stairs, and locomotion level   PARTICIPATION LIMITATIONS: shopping and community activity   PERSONAL FACTORS Behavior pattern, Time since onset of injury/illness/exacerbation, and 1 comorbidity: Neutropenia  are also affecting patient's functional outcome.    REHAB POTENTIAL: Excellent   CLINICAL DECISION MAKING: Stable/uncomplicated   EVALUATION COMPLEXITY: Low     GOALS: Goals reviewed with patient? Yes     LONG TERM GOALS: Target date: 12/26/2021    Pt will be I with HEP for hips, knees, core  Baseline: given on eval  Goal status: INITIAL   2.  Pt will resume walking routine 3 x per week, > 15 min at a time with pain controlled < 4/10 Baseline: does not currently do Goal status: INITIAL   3.  Pt will ascend stairs reciprocally with min increase in knee pain and 1 rail.  Baseline: moderate pain , avoids with Rt LE  Goal status: INITIAL   4.  Pt will increase bilateral hip extension and abduction strength to 4/5 or more for max stability  Baseline: 3/5  Goal status: INITIAL   5.  LEFS/balance goal TBA Baseline:  Goal status: INITIAL     PLAN: PT FREQUENCY: 1-2x/week   PT DURATION: 6 weeks   PLANNED INTERVENTIONS: Therapeutic exercises, Therapeutic activity, Neuromuscular re-education, Balance training, Gait training, Patient/Family education, Joint mobilization, DME instructions, Aquatic Therapy, Dry Needling, Electrical stimulation, Cryotherapy, Moist heat, Taping,  Ionotophoresis 4mg /ml Dexamethasone, Manual therapy, and Re-evaluation   PLAN FOR NEXT SESSION: Nustep/bike, LE strength and tape for swelling?   , PT 11/14/21 12:08 PM Phone: [phone] Fax: [phone]       (Copy Eval's Objective through Plan section here)   Demarian Epps, PT 11/30/2021, 7:54 AM

## 2021-11-30 NOTE — Telephone Encounter (Signed)
Called patient regarding her missed appt this AM.  Reminded of the attendance policy and of her next appt Friday.    Karie Mainland, PT 11/30/21 10:08 AM Phone: [phone] Fax: [phone]

## 2021-12-01 ENCOUNTER — Other Ambulatory Visit: Payer: Self-pay

## 2021-12-01 NOTE — Progress Notes (Signed)
Referral faxed to [facility]. 

## 2021-12-02 ENCOUNTER — Ambulatory Visit: Payer: Medicare Other | Admitting: Physical Therapy

## 2021-12-07 ENCOUNTER — Ambulatory Visit: Payer: Medicare Other | Admitting: Physical Therapy

## 2021-12-09 ENCOUNTER — Ambulatory Visit: Payer: Medicare Other | Admitting: Physical Therapy

## 2021-12-14 ENCOUNTER — Encounter: Payer: Medicaid Other | Admitting: Physical Therapy

## 2021-12-16 ENCOUNTER — Encounter: Payer: Medicaid Other | Admitting: Physical Therapy

## 2021-12-19 ENCOUNTER — Ambulatory Visit: Payer: Medicare Other | Admitting: Physician Assistant

## 2021-12-19 ENCOUNTER — Ambulatory Visit (INDEPENDENT_AMBULATORY_CARE_PROVIDER_SITE_OTHER): Payer: Medicare Other | Admitting: Physician Assistant

## 2021-12-19 ENCOUNTER — Encounter: Payer: Self-pay | Admitting: Physician Assistant

## 2021-12-19 DIAGNOSIS — G8929 Other chronic pain: Secondary | ICD-10-CM | POA: Diagnosis not present

## 2021-12-19 DIAGNOSIS — M25561 Pain in right knee: Secondary | ICD-10-CM | POA: Diagnosis not present

## 2021-12-19 MED ORDER — LIDOCAINE HCL 1 % IJ SOLN
2.0000 mL | INTRAMUSCULAR | Status: AC | PRN
  Administered 2021-12-19: 2 mL

## 2021-12-19 MED ORDER — METHYLPREDNISOLONE ACETATE 40 MG/ML IJ SUSP
80.0000 mg | INTRAMUSCULAR | Status: AC | PRN
  Administered 2021-12-19: 80 mg via INTRA_ARTICULAR

## 2021-12-19 MED ORDER — BUPIVACAINE HCL 0.25 % IJ SOLN
2.0000 mL | INTRAMUSCULAR | Status: AC | PRN
  Administered 2021-12-19: 2 mL via INTRA_ARTICULAR

## 2021-12-19 NOTE — Progress Notes (Signed)
Office Visit Note   Patient: Valerie Dunn           Date of Birth: 1951/05/27           MRN: [id number] Visit Date: 12/19/2021              Requested by: Georganna Skeans, MD [address] [place],  [place] [address] PCP: Georganna Skeans, MD  Chief Complaint  Patient presents with   Right Knee - Follow-up      HPI: Patient presents in follow-up today for her right knee pain.  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  At her last visit I recommended physical therapy.  She admits that she is having difficulty with transportation in the area and only went to 1 physical therapy session.  She was given home exercises to do which she is trying to be consistent with.  She was treated previously in [place] with injections and narcotic pain medication.  She is not asking for narcotic pain medication today.  At her previous visit it had been too soon to do an injection.  Assessment & Plan: Visit Diagnoses:  1. Chronic pain of right knee     Plan: Discussed going forward with an injection today.  She is agreeable to this.  She will also continue to do the exercises.  Follow-up if no improvement  Follow-Up Instructions: No follow-ups on file.   Ortho Exam  Patient is alert, oriented, no adenopathy, well-dressed, normal affect, normal respiratory effort. Right knee was examined today.  No effusion no redness no warmth.  She does have mild soft tissue swelling.  Pain with range of motion and some crepitus.  Tenderness to palpation of the lateral joint line and beneath the patella  Imaging: No results found. No images are attached to the encounter.  Labs: Lab Results  Component Value Date   ESRSEDRATE 26 (H) 12/22/2020     Lab Results  Component Value Date   ALBUMIN 4.0 11/09/2021   ALBUMIN 4.3 10/19/2021   ALBUMIN 4.1 06/27/2021    Lab Results  Component Value Date   MG 1.9  06/28/2021   Lab Results  Component Value Date   VD25OH 12.0 (L) 10/19/2021    No results found for: "PREALBUMIN"    Latest Ref Rng & Units 11/09/2021    9:07 AM 10/19/2021   10:26 AM 06/27/2021    6:50 PM  CBC EXTENDED  WBC 4.0 - 10.5 K/uL 2.6  2.4  2.9   RBC 3.87 - 5.11 MIL/uL 4.23  4.43  5.03   Hemoglobin 12.0 - 15.0 g/dL 99.3  57.0  17.7   HCT 36.0 - 46.0 % 37.0  39.5  43.3   Platelets 150 - 400 K/uL 273  311  298   NEUT# 1.7 - 7.7 K/uL 1.5  1.3    Lymph# 0.7 - 4.0 K/uL 0.4  0.5       There is no height or weight on file to calculate BMI.  Orders:  No orders of the defined types were placed in this encounter.  No orders of the defined types were placed in this encounter.    Procedures: Large Joint Inj on 12/19/2021 10:57 AM Indications: pain and diagnostic evaluation Details: 25 G 1.5 in needle, anterolateral approach  Arthrogram: No  Medications: 80 mg methylPREDNISolone acetate 40 MG/ML; 2 mL lidocaine 1 %; 2 mL bupivacaine 0.25 %  Outcome: tolerated well, no immediate complications Procedure, treatment alternatives, risks and benefits explained, specific risks discussed. Consent was given by the patient.    Clinical Data: No additional findings.  ROS:  All other systems negative, except as noted in the HPI. Review of Systems  All other systems reviewed and are negative.   Objective: Vital Signs: There were no vitals taken for this visit.  Specialty Comments:  No specialty comments available.  PMFS History: Patient Active Problem List   Diagnosis Date Noted   Abnormal thyroid blood test 10/20/2021   Essential hypertension 10/19/2021   Insomnia 10/19/2021   Pain in right knee 10/19/2021   Gastroesophageal reflux disease without esophagitis 10/19/2021   Hypokalemia 10/19/2021   Neutropenia (HCC) 10/19/2021   B12 deficiency 10/19/2021   Past Medical History:  Diagnosis Date   Hypertension     History reviewed. No pertinent family history.   History reviewed. No pertinent surgical history. Social History   Occupational History   Not on file  Tobacco Use   Smoking status: Never   Smokeless tobacco: Never  Substance and Sexual Activity   Alcohol use: Not Currently   Drug use: Not Currently   Sexual activity: Not on file

## 2021-12-21 ENCOUNTER — Encounter: Payer: Medicaid Other | Admitting: Physical Therapy

## 2021-12-23 ENCOUNTER — Encounter: Payer: Medicaid Other | Admitting: Physical Therapy

## 2022-02-13 ENCOUNTER — Ambulatory Visit: Payer: Medicare Other | Admitting: Podiatry

## 2022-03-20 ENCOUNTER — Ambulatory Visit: Payer: Medicaid Other | Admitting: Family Medicine

## 2023-06-21 IMAGING — DX DG ABDOMEN 1V
1 series · 1 of 1 positions shown · non-contrast
Comparison: None.

CLINICAL DATA: Constipation for 6 days.  Back pain.

EXAM:
ABDOMEN - 1 VIEW

[t abdomen supine]
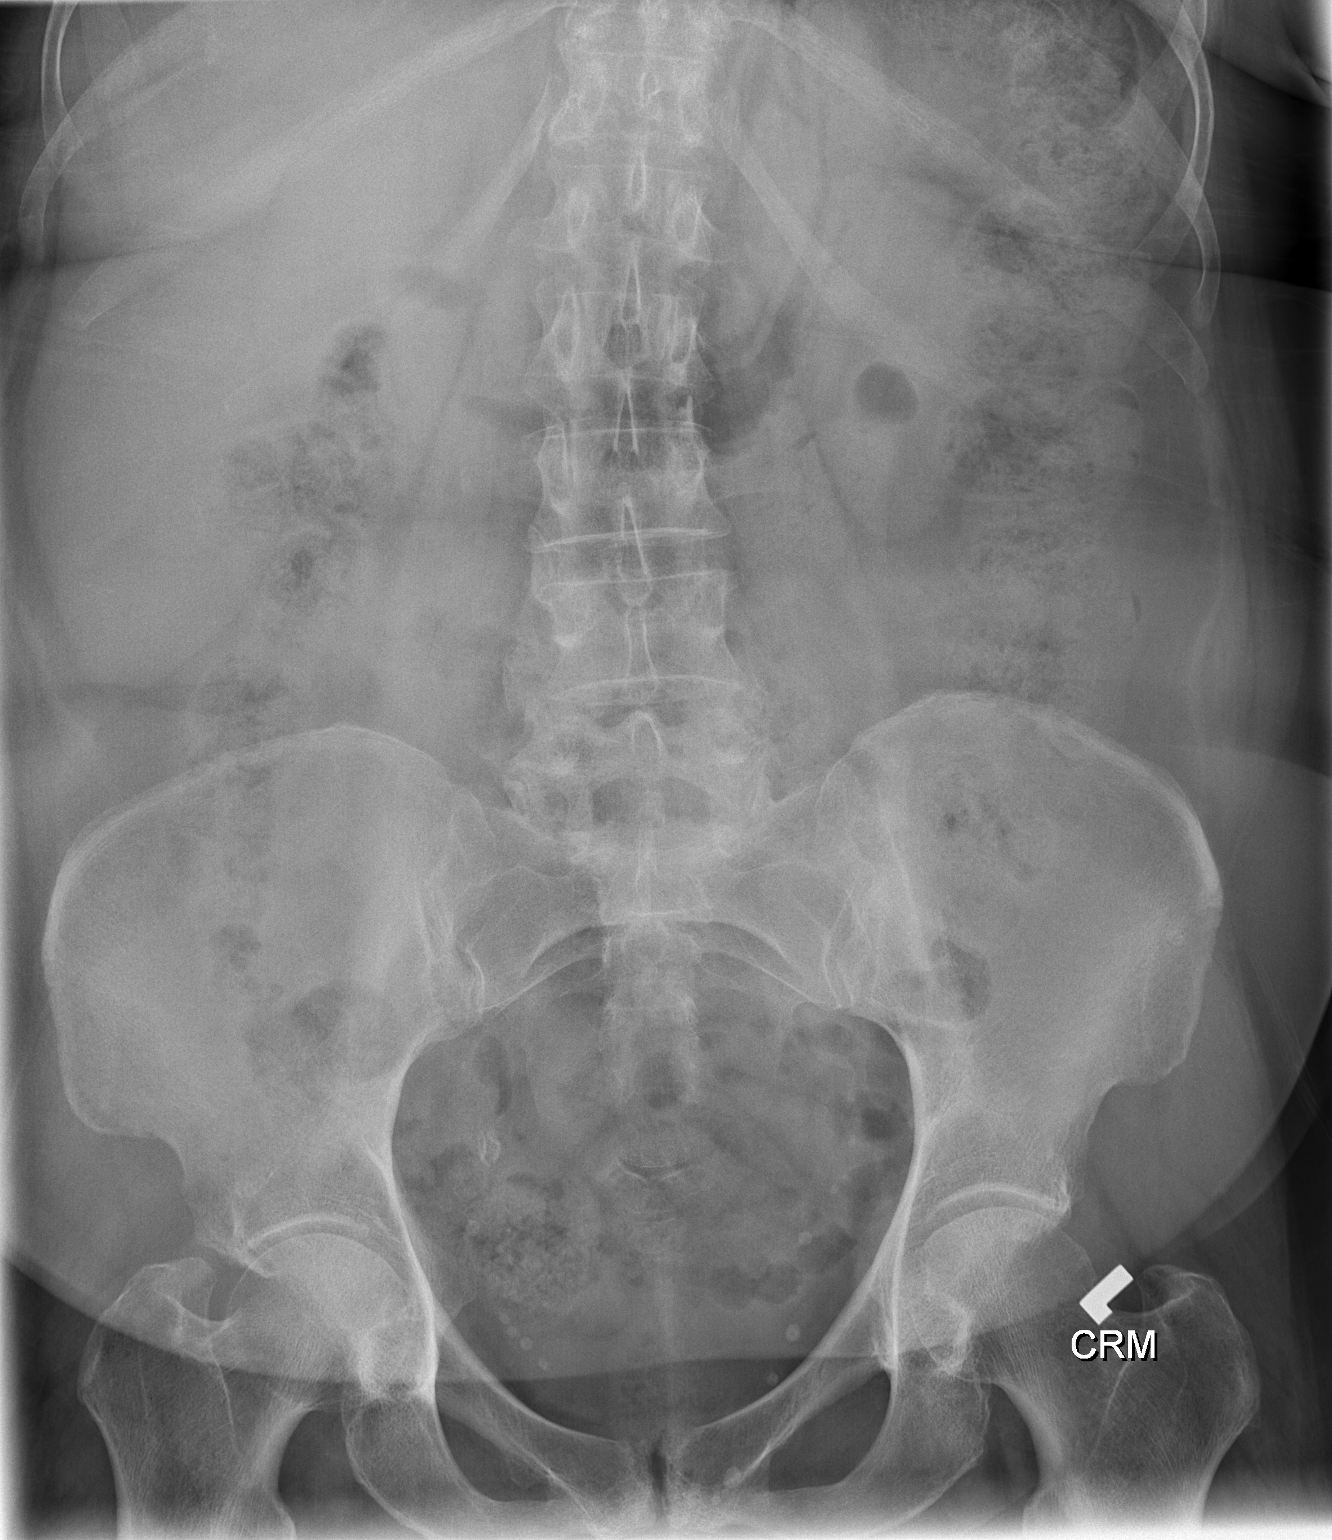

[1 of 1 positions shown; findings below may reference images not displayed]

FINDINGS: Supine view of the abdomen and pelvis demonstrates a nonobstructive
bowel-gas pattern. There is a large amount of stool throughout
especially the left-sided colon. No abnormal abdominal
calcifications. No appendicolith. Probable phleboliths in the
pelvis. Minimal convex right thoracic spine curvature.
IMPRESSION: 1. No acute findings.
2. Possible constipation.

## 2023-06-22 IMAGING — CT CT HEAD W/O CM
4 series · 17 of 47 positions shown, 19 images · non-contrast
Comparison: None.

CLINICAL DATA: Headaches which are worsening



[Series 3: head wo · axial · 0.40mm/px · z∈[-152,-32]mm · 7 of 32 slices shown, 9 images]
[im 4/32  brain]
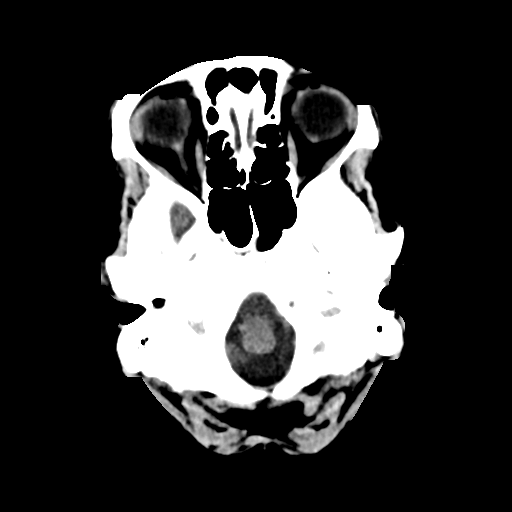
[im 4/32  bone]
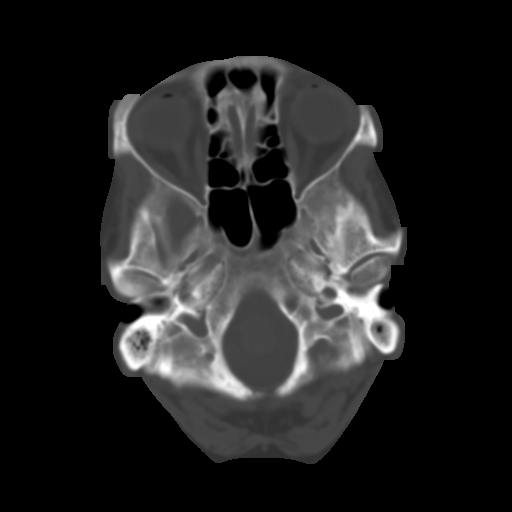
[im 8/32  brain]
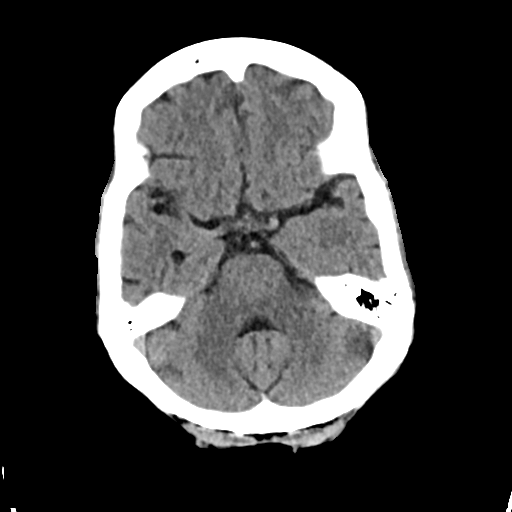
[im 12/32  brain]
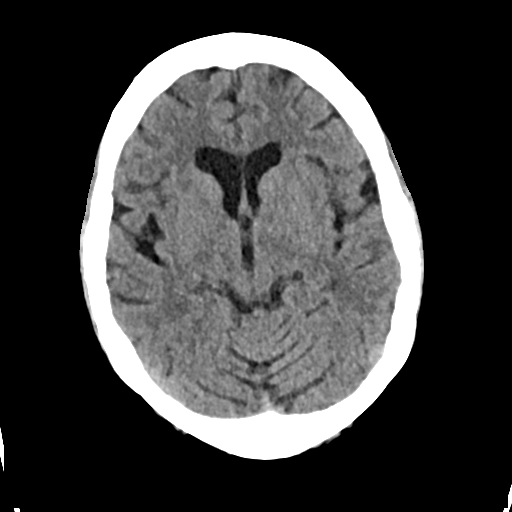
[im 16/32  brain]
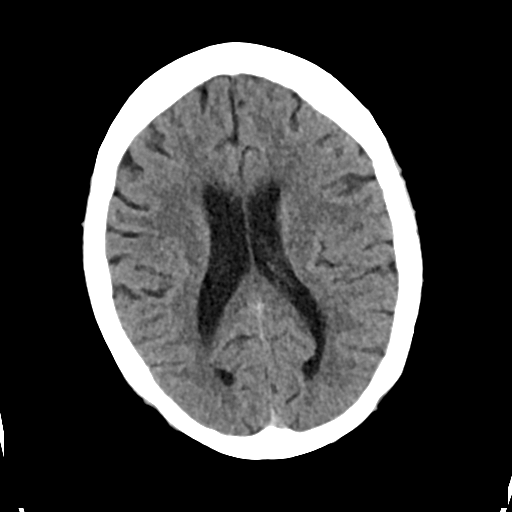
[im 20/32  brain]
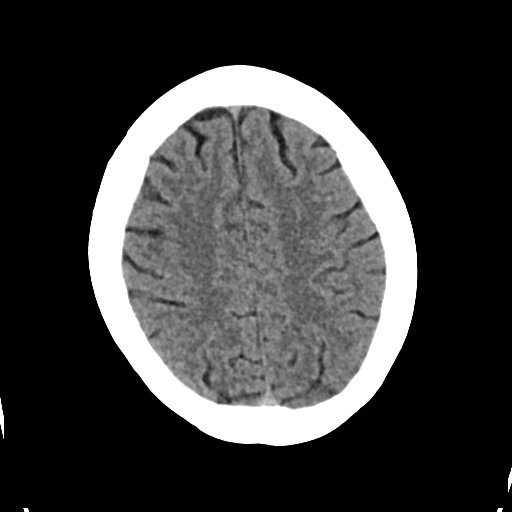
[im 20/32  bone]
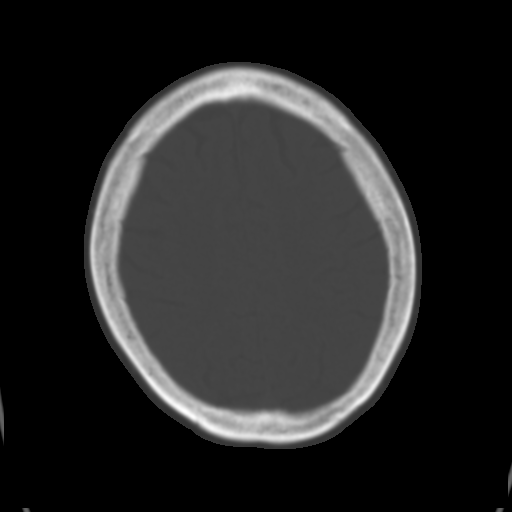
[im 24/32  brain]
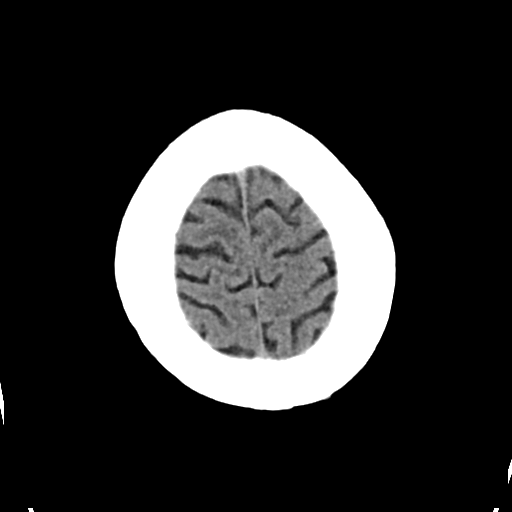
[im 28/32  brain]
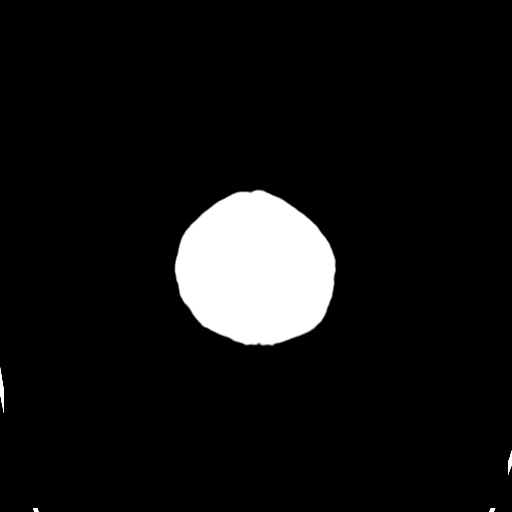

[Series 4: head bone · axial · 0.40mm/px · z∈[-154,-100]mm · 4 of 78 slices shown]
[im 8/78  bone]
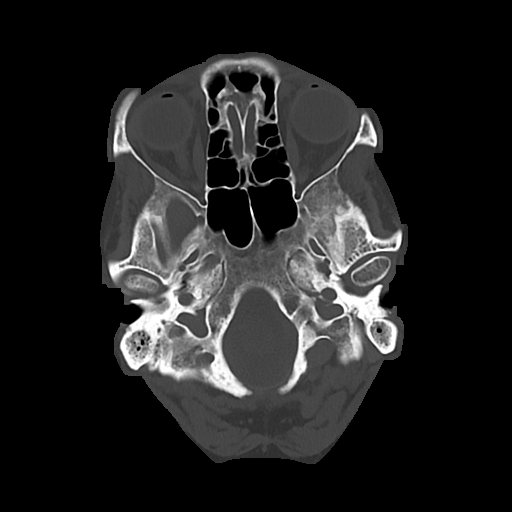
[im 16/78  bone]
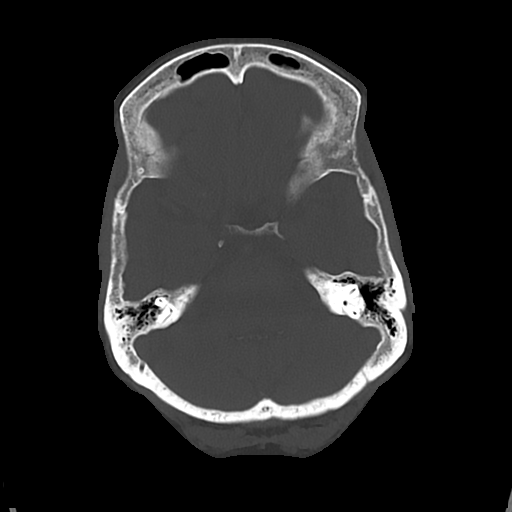
[im 24/78  bone]
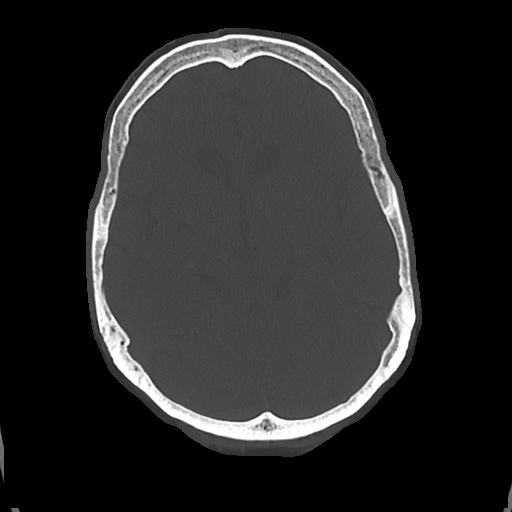
[im 35/78  bone]
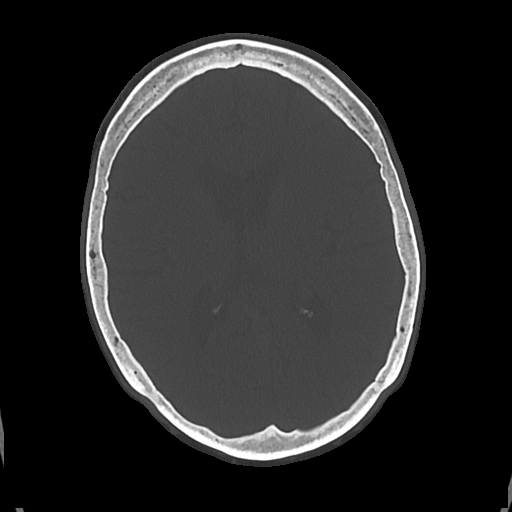

[Series 5: cor soft · coronal · 0.30mm/px · 3 of 65 slices shown]
[im 22/65  brain]
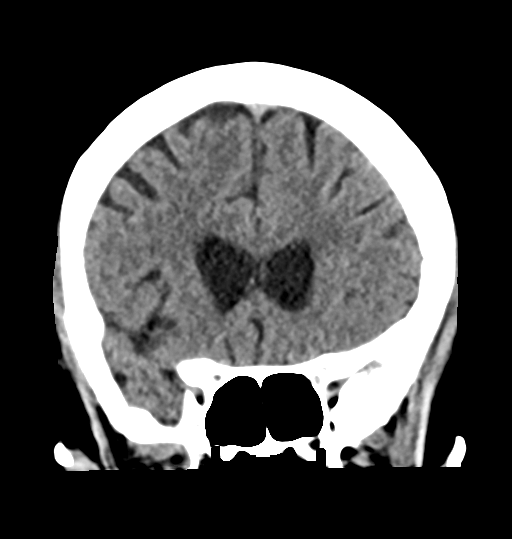
[im 29/65  brain]
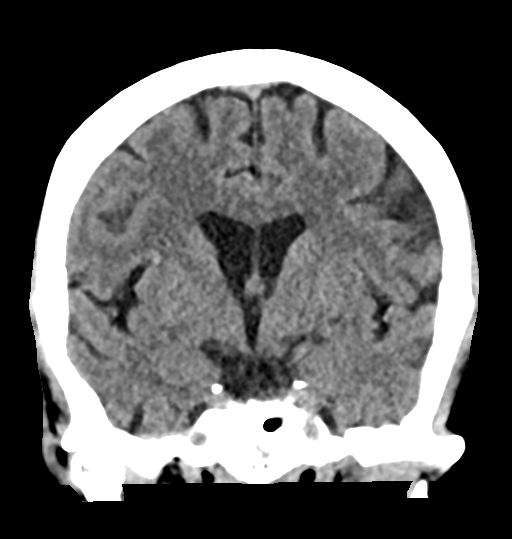
[im 36/65  brain]
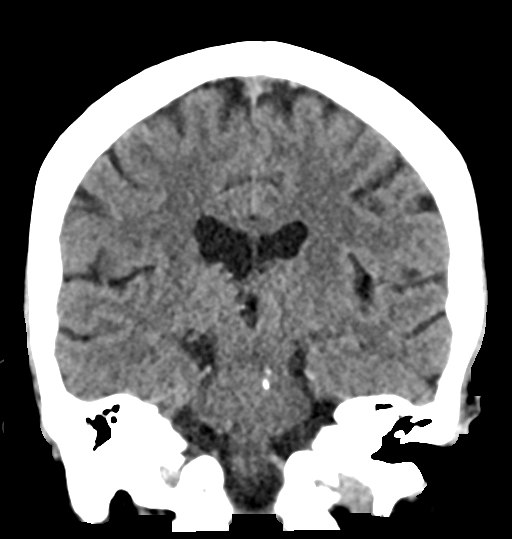

[Series 6: sag soft · sagittal · 0.31mm/px · 3 of 51 slices shown]
[im 17/51  brain]
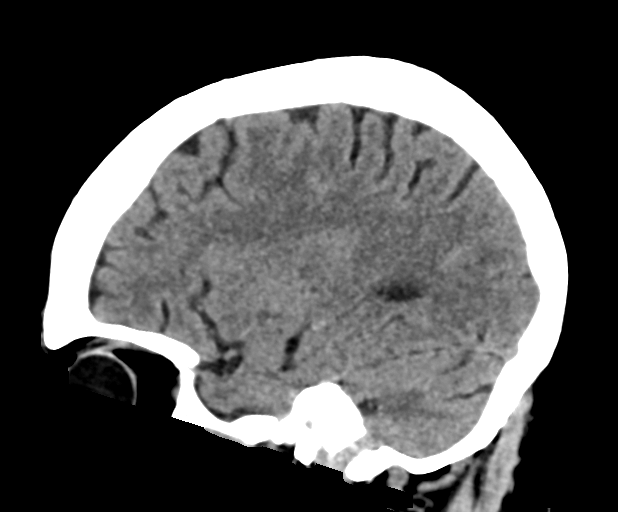
[im 26/51  brain]
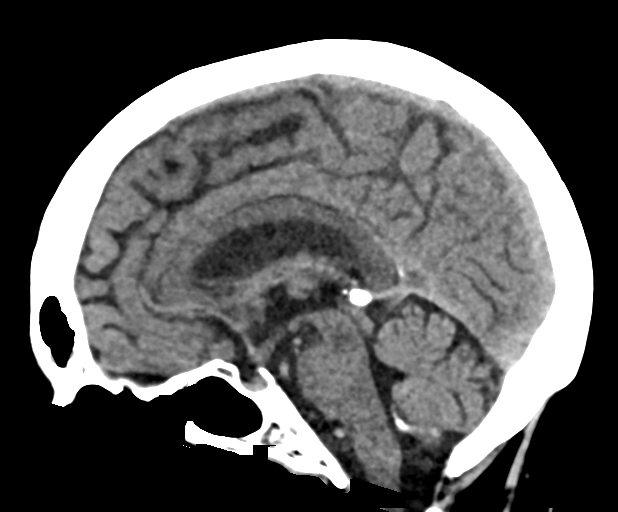
[im 34/51  brain]
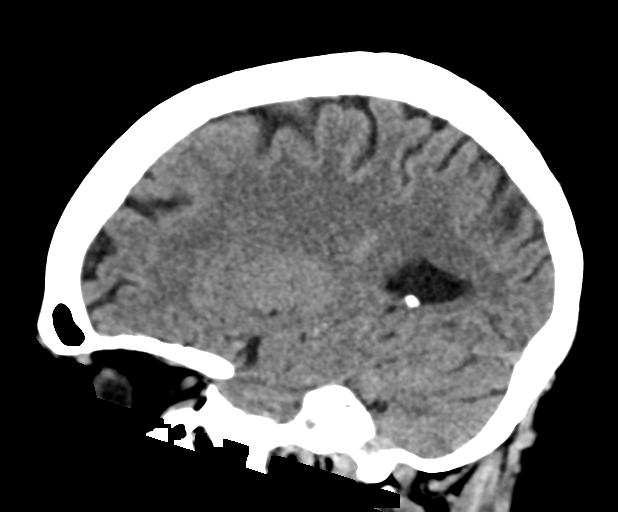

[17 of 47 positions shown; findings below may reference images not displayed]

FINDINGS: Brain: No evidence of acute infarction, hemorrhage, hydrocephalus,
extra-axial collection or mass lesion/mass effect.

Vascular: No hyperdense vessel or unexpected calcification.

Skull: No osseous abnormality.

Sinuses/Orbits: Visualized paranasal sinuses are clear. Visualized
mastoid sinuses are clear. Visualized orbits demonstrate no focal
abnormality.

Other: None
IMPRESSION: No acute intracranial pathology.

## 2023-11-16 IMAGING — US US ABDOMEN COMPLETE
1 series · 14 of 25 positions shown · non-contrast
Comparison: None Available.

CLINICAL DATA: Liver or spleen disease

EXAM:
ABDOMEN ULTRASOUND COMPLETE

[Series 1: us abdomen complete · 14 of 84 slices shown]
[im 1/84]
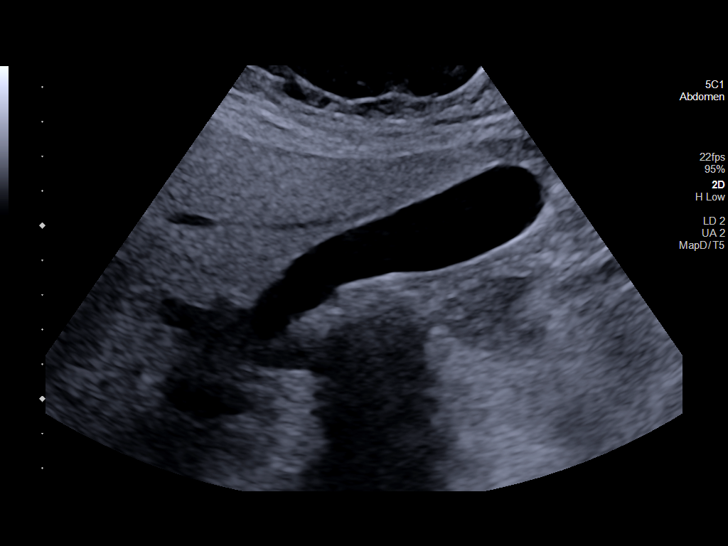
[im 7/84]
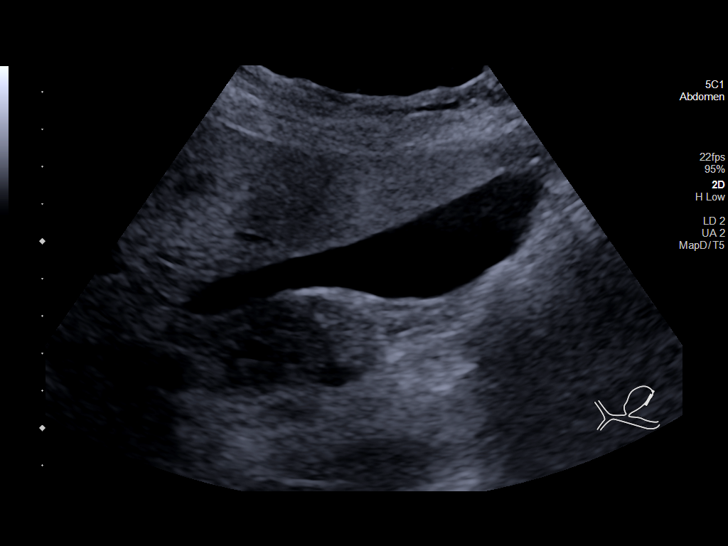
[im 14/84]
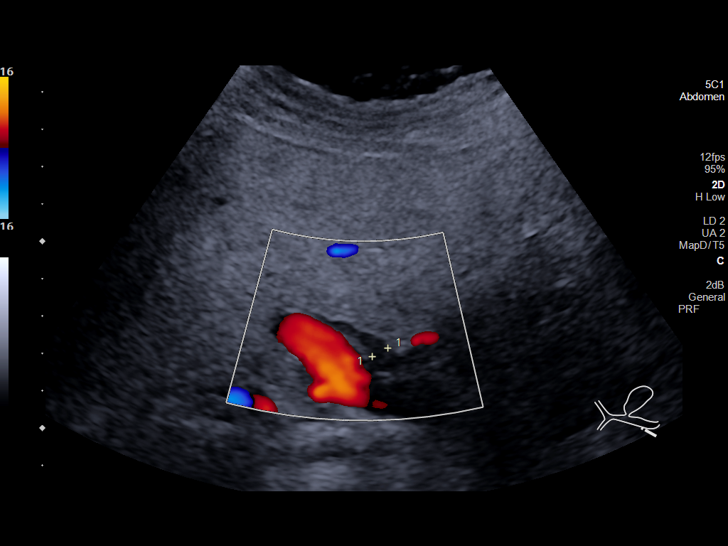
[im 21/84]
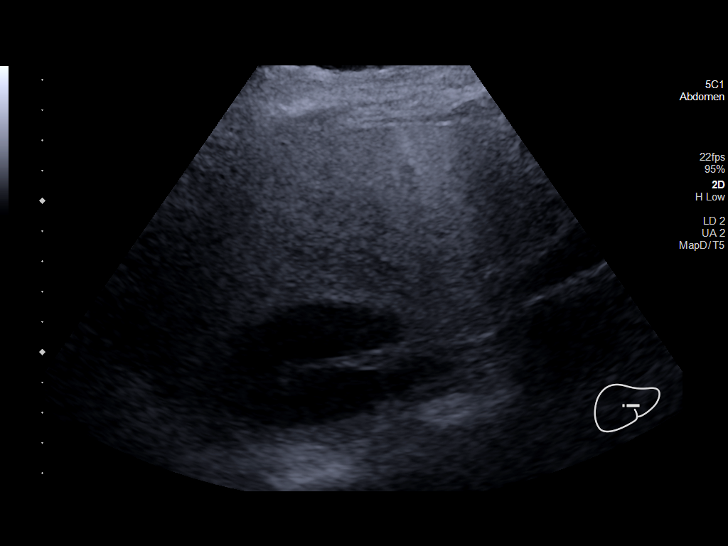
[im 28/84]
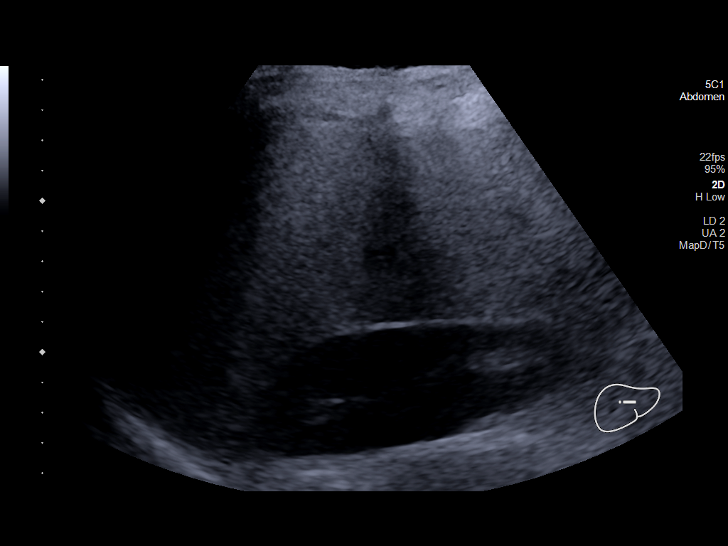
[im 32/84]
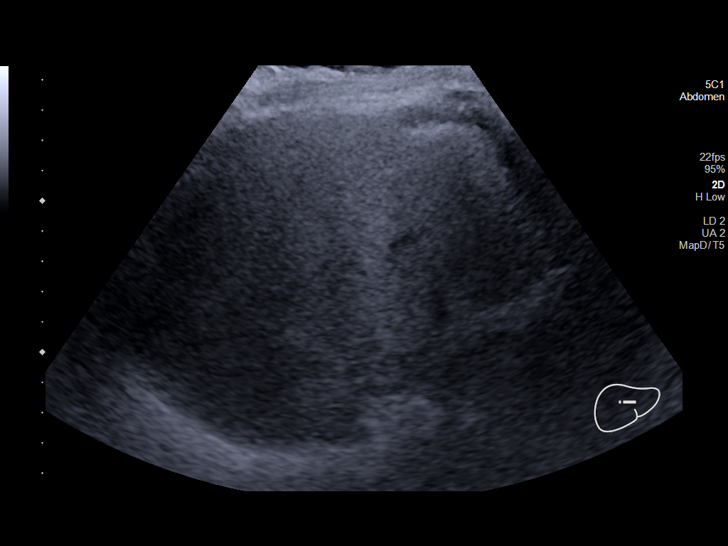
[im 39/84]
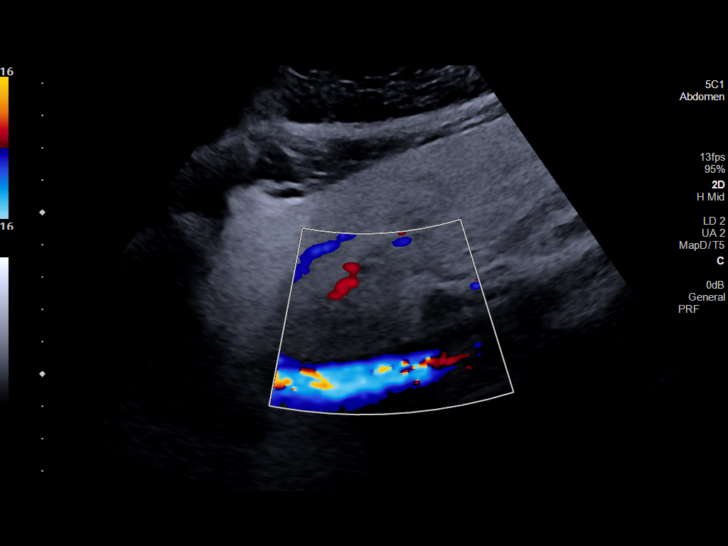
[im 45/84]
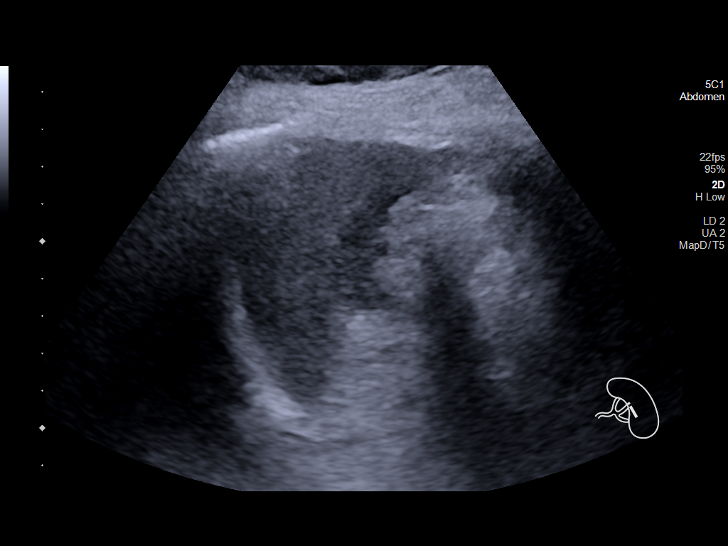
[im 52/84]
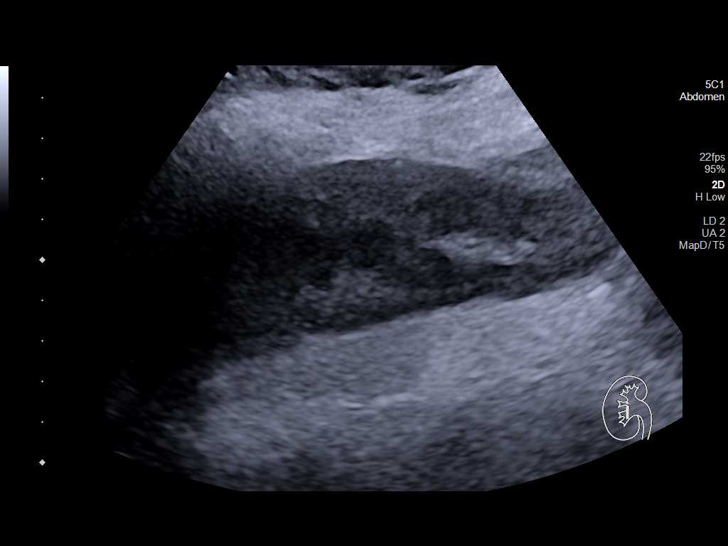
[im 56/84]
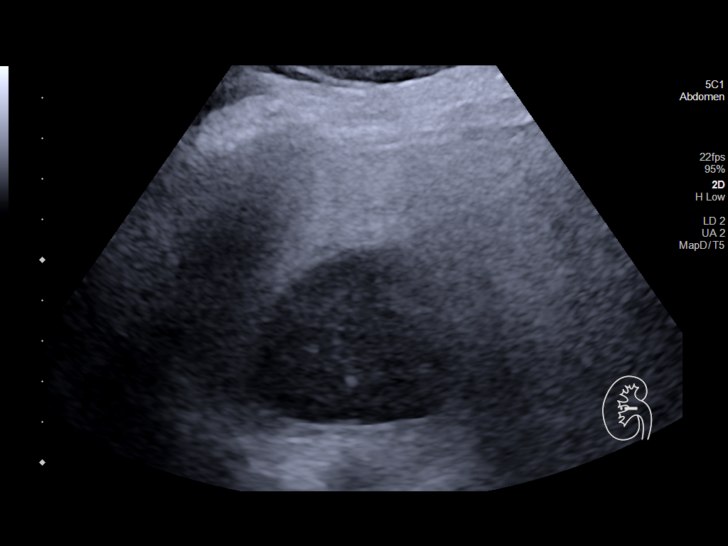
[im 63/84]
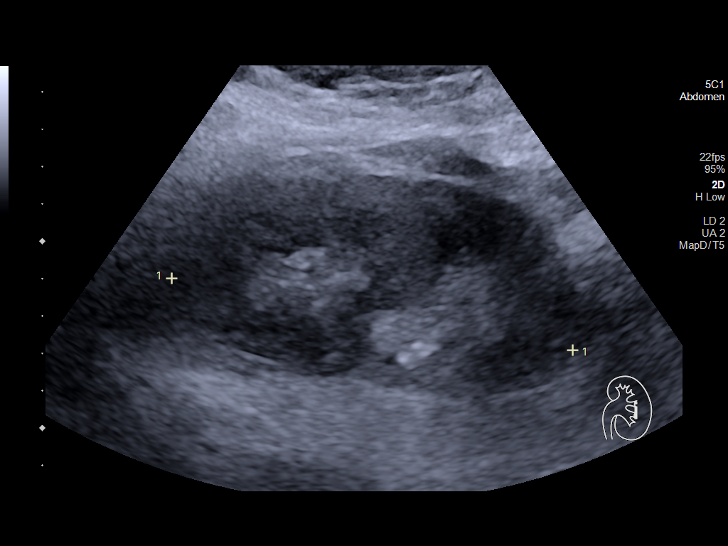
[im 70/84]
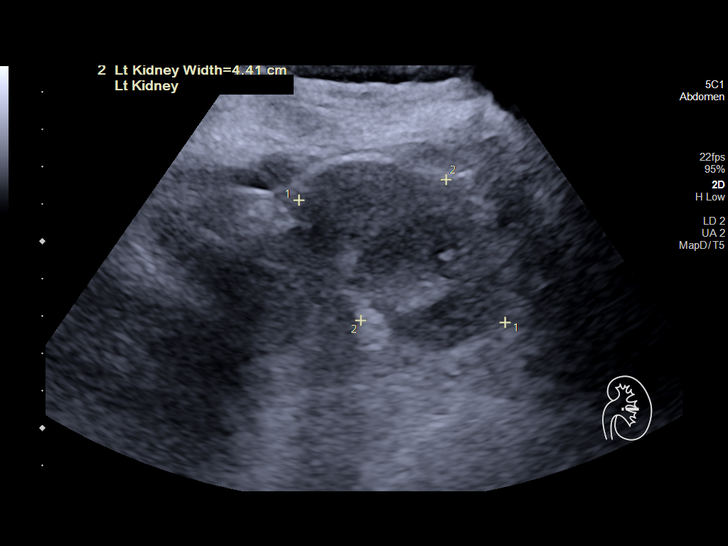
[im 77/84]
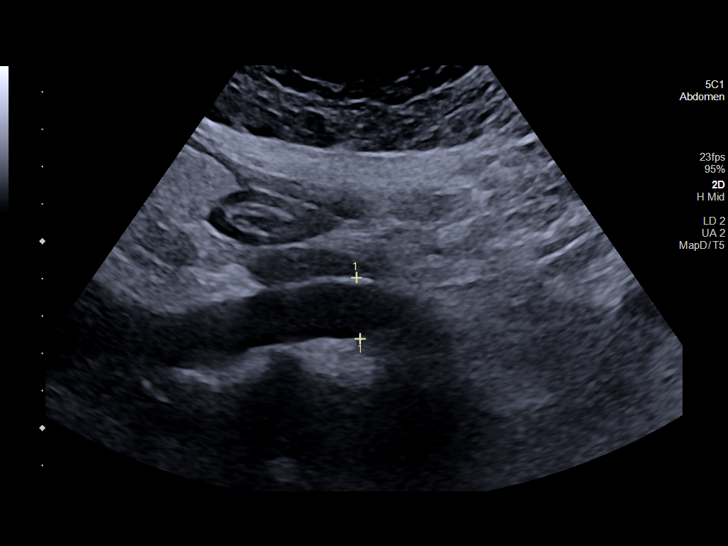
[im 84/84]
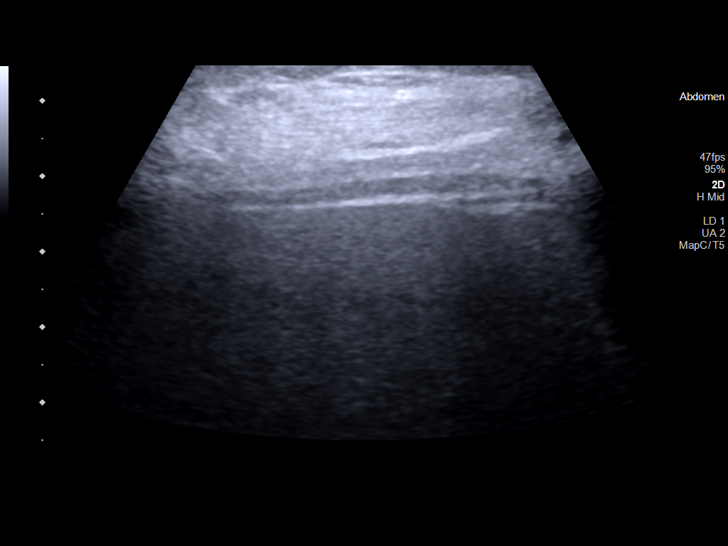

[14 of 25 positions shown; findings below may reference images not displayed]

FINDINGS: Gallbladder: No gallstones or wall thickening visualized. No
sonographic Murphy sign noted by sonographer.

Common bile duct: Diameter: 5 mm

Liver: Appears enlarged with diffuse increased echogenicity of the
parenchyma. No focal mass identified. Portal vein is patent on color
Doppler imaging with normal direction of blood flow towards the
liver.

IVC: No abnormality visualized.

Pancreas: Visualized portion unremarkable.

Spleen: Size and appearance within normal limits.

Right Kidney: Length: 12.5 cm. Echogenicity within normal limits. No
mass or hydronephrosis visualized.

Left Kidney: Length: 10.9 cm. Echogenicity within normal limits. No
mass or hydronephrosis visualized.

Abdominal aorta: No aneurysm visualized.

Other findings: None.
IMPRESSION: Liver appears enlarged with increased echogenicity of the parenchyma
which may represent hepatic steatosis and/or other hepatocellular
disease.
